# Patient Record
Sex: Male | Born: 1945 | Race: White | Hispanic: No | Marital: Married | Smoking: Former smoker
Health system: Southern US, Community
[De-identification: ages and names within clinical notes are randomized; demographics above are authoritative.]

## PROBLEM LIST (undated history)

## (undated) DIAGNOSIS — F3289 Other specified depressive episodes: Secondary | ICD-10-CM

## (undated) DIAGNOSIS — R7301 Impaired fasting glucose: Secondary | ICD-10-CM

## (undated) DIAGNOSIS — M1610 Unilateral primary osteoarthritis, unspecified hip: Secondary | ICD-10-CM

## (undated) DIAGNOSIS — K5730 Diverticulosis of large intestine without perforation or abscess without bleeding: Secondary | ICD-10-CM

## (undated) DIAGNOSIS — F411 Generalized anxiety disorder: Secondary | ICD-10-CM

## (undated) DIAGNOSIS — I519 Heart disease, unspecified: Secondary | ICD-10-CM

## (undated) DIAGNOSIS — M1990 Unspecified osteoarthritis, unspecified site: Secondary | ICD-10-CM

## (undated) DIAGNOSIS — E669 Obesity, unspecified: Secondary | ICD-10-CM

## (undated) DIAGNOSIS — F329 Major depressive disorder, single episode, unspecified: Secondary | ICD-10-CM

## (undated) HISTORY — DX: Other specified depressive episodes: F32.89

## (undated) HISTORY — PX: KNEE SURGERY: SHX244

## (undated) HISTORY — DX: Impaired fasting glucose: R73.01

## (undated) HISTORY — DX: Obesity, unspecified: E66.9

## (undated) HISTORY — DX: Heart disease, unspecified: I51.9

## (undated) HISTORY — PX: APPENDECTOMY: SHX54

## (undated) HISTORY — DX: Generalized anxiety disorder: F41.1

## (undated) HISTORY — DX: Unilateral primary osteoarthritis, unspecified hip: M16.10

## (undated) HISTORY — DX: Major depressive disorder, single episode, unspecified: F32.9

## (undated) HISTORY — DX: Diverticulosis of large intestine without perforation or abscess without bleeding: K57.30

---

## 2005-10-31 ENCOUNTER — Ambulatory Visit: Payer: Self-pay | Admitting: Family Medicine

## 2007-08-24 ENCOUNTER — Encounter: Admission: RE | Admit: 2007-08-24 | Discharge: 2007-08-24 | Payer: Self-pay | Admitting: Family Medicine

## 2007-08-24 ENCOUNTER — Ambulatory Visit: Payer: Self-pay | Admitting: Family Medicine

## 2007-09-11 ENCOUNTER — Ambulatory Visit: Payer: Self-pay | Admitting: Gastroenterology

## 2007-09-11 ENCOUNTER — Encounter: Payer: Self-pay | Admitting: Gastroenterology

## 2007-09-24 ENCOUNTER — Ambulatory Visit: Payer: Self-pay | Admitting: Cardiology

## 2007-09-25 ENCOUNTER — Ambulatory Visit: Payer: Self-pay | Admitting: Gastroenterology

## 2007-11-30 ENCOUNTER — Ambulatory Visit: Payer: Self-pay | Admitting: Family Medicine

## 2008-02-08 DIAGNOSIS — K5730 Diverticulosis of large intestine without perforation or abscess without bleeding: Secondary | ICD-10-CM

## 2008-02-08 HISTORY — PX: NASAL TURBINATE REDUCTION: SHX2072

## 2008-02-08 HISTORY — PX: UVULECTOMY: SHX2631

## 2008-02-08 HISTORY — DX: Diverticulosis of large intestine without perforation or abscess without bleeding: K57.30

## 2009-03-02 ENCOUNTER — Ambulatory Visit: Payer: Self-pay | Admitting: Family Medicine

## 2009-04-28 ENCOUNTER — Ambulatory Visit: Payer: Self-pay | Admitting: Physician Assistant

## 2009-10-08 ENCOUNTER — Ambulatory Visit: Payer: Self-pay | Admitting: Family Medicine

## 2009-12-28 ENCOUNTER — Ambulatory Visit: Payer: Self-pay | Admitting: Family Medicine

## 2010-03-09 NOTE — Miscellaneous (Signed)
Summary: GI previsit  Clinical Lists Changes  Medications: Added new medication of MOVIPREP 100 GM  SOLR (PEG-KCL-NACL-NASULF-NA ASC-C) As per prep instructions. - Signed Rx of MOVIPREP 100 GM  SOLR (PEG-KCL-NACL-NASULF-NA ASC-C) As per prep instructions.;  #1 x 0;  Signed;  Entered by: Jennye Boroughs RN;  Authorized by: Meryl Dare MD [facility];  Method used: Electronic    Prescriptions: MOVIPREP 100 GM  SOLR (PEG-KCL-NACL-NASULF-NA ASC-C) As per prep instructions.  #1 x 0   Entered by:   Jennye Boroughs RN   Authorized by:   Meryl Dare MD [facility]   Signed by:   Jennye Boroughs RN on 09/11/2007   Method used:   Electronically sent to ...       [facility]  [address] [id number]       [address]       [place], [place]  [address]       Ph: [phone] or [phone]       Fax: [phone]   RxID:   [id number]

## 2010-04-26 ENCOUNTER — Ambulatory Visit (INDEPENDENT_AMBULATORY_CARE_PROVIDER_SITE_OTHER): Payer: BC Managed Care – PPO | Admitting: Family Medicine

## 2010-04-26 DIAGNOSIS — M25559 Pain in unspecified hip: Secondary | ICD-10-CM

## 2010-04-26 DIAGNOSIS — H8110 Benign paroxysmal vertigo, unspecified ear: Secondary | ICD-10-CM

## 2010-06-22 NOTE — Assessment & Plan Note (Signed)
[facility]                            CARDIOLOGY OFFICE NOTE   NAME:Jacob Huber                        MRN:          [id number]  DATE:09/24/2007                            DOB:          05-17-1945    Mr.  Jacob Huber is a 65 year old married white male who is referred  by Dr. Sharlot Gowda for consultation concerning a family history of  coronary artery disease and an abnormal EKG.   Ms. Scalise is again 65 years of age and is currently having no symptoms  of angina.  He just got back from a camping trip at the beach and said  the more he walked, the better he felt.  He denies any exertional angina  or shortness of breath.   His risk factors for coronary artery disease include age, sex, and being  a bit overweight.  He also is fairly sedentary only doing weekend yard  work.   PAST MEDICAL HISTORY:  He quit smoking 12 years ago.  He does not drink  alcohol.   He has no history of dye allergies.   Current meds are Arthrotec 75 mg b.i.d., multivitamin, Omega-3, prostate  supplement, and enteric-coated aspirin 325 p.r.n.   SURGICAL HISTORY:  Appendectomy in 1970, knee surgery in 1975, and  sinus surgery in 1979.   FAMILY HISTORY:  His mother had a heart attack at age 34 and died.  She  had multiple cardiac risk factors however.   SOCIAL HISTORY:  He is Manufacturing systems engineer.  He has 2 children.  He is  married.   REVIEW OF SYSTEMS:  Completely negative with all systems questioned.   PHYSICAL EXAMINATION:  VITAL SIGNS:  His blood pressure today is 143/90,  his pulse 77 and regular, his weight is 241 pounds, he is 6 feet tall.  HEENT:  He is bearded.  PERRLA.  Extraocular movements intact.  Sclerae  are clear.  Facial symmetry is normal.  Carotid upstrokes are equal  bilaterally without bruits.  No JVD.  Thyroid is not enlarged.  NECK:  Supple.  LUNGS:  Clear.  HEART:  Reveals a regular rate and rhythm.  No gallop.  ABDOMEN:  Soft.  Good  bowel sounds.  No midline bruit.  No hepatomegaly.  EXTREMITIES:  No cyanosis, clubbing, or edema.  Pulses are intact.  NEURO:  Intact.   EKG shows normal sinus rhythm with minimal early repolarization.   ASSESSMENT/PLAN:  Mr. Dykstra is currently not having any symptoms of  ischemic heart disease.  We reviewed at length his risk factors  including age, sex, being overweight, and sedentary lifestyle.  His risk  is still quite low of having obstructive coronary disease.  I do not  think a stress test will be helpful.   PLAN:  1. A 20-25 pounds of weight reduction.  2. Three hours of exercise per week.   I just received his lipid panel, which is remarkably reassuring.  His  total cholesterol is 128, triglycerides 59, HDL is 57, total cholesterol  to HDL ratio was 2.2, LDL is 59, on no meds!  I shared this good news with him as well.   I will see him back on a p.r.n. basis.     Thomas C. Daleen Squibb, MD, [facility]  Electronically Signed    TCW/MedQ  DD: 09/24/2007  DT: 09/25/2007  Job #: [id number]   cc:   Sharlot Gowda, M.D.

## 2010-07-19 ENCOUNTER — Other Ambulatory Visit: Payer: Self-pay | Admitting: Family Medicine

## 2010-07-19 MED ORDER — TRAMADOL HCL 50 MG PO TABS: 50.0000 mg | ORAL_TABLET | Freq: Four times a day (QID) | ORAL | Status: DC | PRN

## 2010-07-19 NOTE — Telephone Encounter (Signed)
Is this ok?

## 2010-07-19 NOTE — Telephone Encounter (Signed)
CALLED MED IN  

## 2010-09-02 ENCOUNTER — Other Ambulatory Visit: Payer: Self-pay | Admitting: Medical

## 2010-09-02 MED ORDER — DULOXETINE HCL 60 MG PO CPEP: 60.0000 mg | ORAL_CAPSULE | Freq: Every day | ORAL | Status: DC

## 2010-10-04 ENCOUNTER — Other Ambulatory Visit: Payer: Self-pay | Admitting: Family Medicine

## 2010-10-05 ENCOUNTER — Other Ambulatory Visit: Payer: Self-pay | Admitting: Family Medicine

## 2010-10-05 ENCOUNTER — Telehealth: Payer: Self-pay | Admitting: Family Medicine

## 2010-10-05 MED ORDER — ZOLPIDEM TARTRATE 10 MG PO TABS: 10.0000 mg | ORAL_TABLET | Freq: Every evening | ORAL | Status: DC | PRN

## 2010-10-05 NOTE — Telephone Encounter (Signed)
Called ambien and tramadol in

## 2010-10-05 NOTE — Telephone Encounter (Signed)
Is this ok?

## 2010-10-18 ENCOUNTER — Ambulatory Visit (INDEPENDENT_AMBULATORY_CARE_PROVIDER_SITE_OTHER): Payer: BC Managed Care – PPO | Admitting: Family Medicine

## 2010-10-18 ENCOUNTER — Encounter: Payer: Self-pay | Admitting: Family Medicine

## 2010-10-18 VITALS — BP 132/80 | HR 84 | Temp 98.9°F | Ht 71.0 in | Wt 232.0 lb

## 2010-10-18 DIAGNOSIS — M26629 Arthralgia of temporomandibular joint, unspecified side: Secondary | ICD-10-CM

## 2010-10-18 DIAGNOSIS — H9209 Otalgia, unspecified ear: Secondary | ICD-10-CM

## 2010-10-18 DIAGNOSIS — H9201 Otalgia, right ear: Secondary | ICD-10-CM

## 2010-10-18 DIAGNOSIS — J309 Allergic rhinitis, unspecified: Secondary | ICD-10-CM

## 2010-10-18 NOTE — Patient Instructions (Addendum)
Bring copies of any outside labwork that was recently done, to your physical, to aid in deciding what labs need to be ordered/repeated  I recommend that you start taking Loratidine (Claritin)--this will help with your sneezing, runny nose. I think there may be a component of temperomandibular joint pain (TMJ) causing your ear pain.  Taking ibuprofen regularly for up to a week may help decrease any inflammation in the joint (4 tablets--800mg , up to three times a day, taken with food).  Stop the ibuprofen if it bothers your stomach.  You may need to take Prilosec or Pepcid or Zantac along with the ibuprofen if it bothers your stomach, and/or lower the dose.    Try and stop grinding your teeth.  Consider talking to your dentist about this problem  TMJ Problems  (Temporal Mandibular Joint Dysfunction) TMJ dysfunction means there are problems with the joint between your jaw and your skull. This is a joint lined by cartilage like other joints in your body but also has a small disc in the joint which keeps the bones from rubbing on each other. These joints are like other joints and can get inflamed (sore) from arthritis and other problems. When this joint gets sore, it can cause headaches and pain in the jaw and the face. CAUSES Usually the arthritic types of problems are caused by soreness in the joint. Soreness in the joint can also be caused by overuse. This may come from grinding your teeth. It may also come from mis-alignment in the joint. DIAGNOSIS (LEARNING WHAT IS WRONG) Diagnosis of this condition can often be made by history and exam. Sometimes your caregiver may need X-rays or an MRI scan to determine the exact cause. It may be necessary to see your dentist to determine if your teeth and jaws are lined up correctly. TREATMENT Most of the time this problem is not serious; however, sometimes it can persist (become chronic). When this happens medications that will cut down on inflammation (soreness)  help. Sometimes a shot of cortisone into the joint will be helpful. If your teeth are not aligned it may help for your dentist to make a splint for your mouth that can help this problem. If no physical problems can be found, the problem may come from tension. If tension is found to be the cause, biofeedback or relaxation techniques may be helpful. HOME CARE INSTRUCTIONS  Later in the day, applications of ice packs may be helpful. Ice can be used in a plastic bag with a towel around it to prevent frostbite to skin. This may be used about every 2 hours for 20 to 30 minutes, as needed while awake, or as directed by your caregiver.   Only take over-the-counter or prescription medicines for pain, discomfort, or fever as directed by your caregiver.   If physical therapy was prescribed, follow your caregiver's directions.   Wear mouth appliances as directed if they were given.  Document Released: 10/19/2000 Document Re-Released: 04/22/2008 [facility] Patient Information 2011 [place], [place].

## 2010-10-18 NOTE — Progress Notes (Signed)
Patient presents with 2 weeks of right ear pain, pressure.  Some discomfort with wiggling of the jaw.  Denies popping or clicking in the ear, decreased hearing.  Has been sneezing, runny nose.  Some sore throat, cough.  Hasn't been using any over-the-counter medications for allergies.  Denies fevers, chills.  He does admit to clenching/grinding his teeth, stress  Past Medical History  Diagnosis Date  . Depressive disorder, not elsewhere classified   . Anxiety state, unspecified   . Hip arthritis     R hip ([facility] in past)  . Impaired fasting glucose     Past Surgical History  Procedure Date  . Appendectomy age 10  . Knee surgery age 84    left knee  . Uvulectomy 2010  . Nasal turbinate reduction 2010   Current outpatient prescriptions:Ascorbic Acid (VITAMIN C) 1000 MG tablet, Take 1,000 mg by mouth daily.  , Disp: , Rfl: ;  Cholecalciferol (VITAMIN D3) 5000 UNITS CAPS, Take 1 tablet by mouth daily.  , Disp: , Rfl: ;  DULoxetine (CYMBALTA) 60 MG capsule, Take 1 capsule (60 mg total) by mouth daily., Disp: 30 capsule, Rfl: 1;  fish oil-omega-3 fatty acids 1000 MG capsule, Take 1 g by mouth daily.  , Disp: , Rfl:  Glucosamine-Chondroit-Vit C-Mn (GLUCOSAMINE CHONDR 1500 COMPLX PO), Take 1 tablet by mouth daily.  , Disp: , Rfl: ;  ibuprofen (ADVIL,MOTRIN) 200 MG tablet, Take 800 mg by mouth every 8 (eight) hours as needed. As needed for hip arthritis pain , Disp: , Rfl: ;  Multiple Vitamins-Minerals (MULTIVITAMIN WITH MINERALS) tablet, Take 1 tablet by mouth daily.  , Disp: , Rfl:  saw palmetto 500 MG capsule, Take 500-1,000 mg by mouth daily.  , Disp: , Rfl: ;  traMADol (ULTRAM) 50 MG tablet, Take 1 tablet (50 mg total) by mouth every 6 (six) hours as needed for pain., Disp: 90 tablet, Rfl: 1;  vitamin E 800 UNIT capsule, Take 800 Units by mouth daily.  , Disp: , Rfl: ;  zolpidem (AMBIEN) 10 MG tablet, Take 1 tablet (10 mg total) by mouth at bedtime as needed for sleep., Disp: 30 tablet, Rfl:  1 aspirin 325 MG tablet, Take 325 mg by mouth daily.  , Disp: , Rfl:   No Known Allergies  ROS:  Denies fevers, skin rash, productive cough, shortness of breath, chest pain, headache.  See HPI  PHYSICAL EXAM: BP 132/80  Pulse 84  Temp(Src) 98.9 F (37.2 C) (Oral)  Ht 5\' 11"  (1.803 m)  Wt 232 lb (105.235 kg)  BMI 32.36 kg/m2 Well developed, pleasant male in no distress Tender at R TMJ; clicking with jaw movement HEENT: PERRL, EOMI, conjunctiva clear.  TM's and EAC's normal bilaterally.  Small bubble noted behind R TM, but no bulging or erythema noted.  Sinuses nontender.  Nasal mucosa mildly edematous, no erythema Neck: no lymphadenopathy or thyromegaly or mass Heart: regular rate and rhythm without murmur Lungs: clear bilaterally Skin: no rash Psych: normal mood, affect, hygiene and grooming  ASSESSMENT/PLAN: 1. Allergic rhinitis, cause unspecified   2. TMJ tenderness   3. Otalgia of right ear    Recommend Claritin for allergies Recommend ibuprofen (up to 800mg  TID with food) for up to a week for treatment of TMJ.  Recommended discussing with his dentist (may need bite guard or other treatment) Avoid gum chewing  F/u for CPE. Due for annual labs. Will bring copies of labs recently done through work

## 2010-10-28 ENCOUNTER — Encounter: Payer: Self-pay | Admitting: Family Medicine

## 2010-11-10 ENCOUNTER — Telehealth: Payer: Self-pay | Admitting: Family Medicine

## 2010-11-10 MED ORDER — DULOXETINE HCL 60 MG PO CPEP: 60.0000 mg | ORAL_CAPSULE | Freq: Every day | ORAL | Status: DC

## 2010-11-10 NOTE — Telephone Encounter (Signed)
E-rx'd to pharmacy.  He is due for CPE, yet hasn't scheduled.  Please have him schedule CPE. Thanks

## 2010-11-10 NOTE — Telephone Encounter (Signed)
Called patient to inform him that rx for Cymbalta was sent to pharmacy. He scheduled CPE for 12/15/10 @9 :30am, just an FYI. Thanks.

## 2010-12-15 ENCOUNTER — Encounter: Payer: Self-pay | Admitting: Family Medicine

## 2010-12-15 ENCOUNTER — Ambulatory Visit (INDEPENDENT_AMBULATORY_CARE_PROVIDER_SITE_OTHER): Payer: BC Managed Care – PPO | Admitting: Family Medicine

## 2010-12-15 VITALS — BP 130/86 | HR 88 | Ht 71.0 in | Wt 234.0 lb

## 2010-12-15 DIAGNOSIS — F329 Major depressive disorder, single episode, unspecified: Secondary | ICD-10-CM

## 2010-12-15 DIAGNOSIS — Z23 Encounter for immunization: Secondary | ICD-10-CM

## 2010-12-15 DIAGNOSIS — F3289 Other specified depressive episodes: Secondary | ICD-10-CM | POA: Insufficient documentation

## 2010-12-15 DIAGNOSIS — Z125 Encounter for screening for malignant neoplasm of prostate: Secondary | ICD-10-CM

## 2010-12-15 DIAGNOSIS — Z0000 Encounter for general adult medical examination without abnormal findings: Secondary | ICD-10-CM

## 2010-12-15 DIAGNOSIS — M25559 Pain in unspecified hip: Secondary | ICD-10-CM

## 2010-12-15 DIAGNOSIS — Z2911 Encounter for prophylactic immunotherapy for respiratory syncytial virus (RSV): Secondary | ICD-10-CM

## 2010-12-15 DIAGNOSIS — Z79899 Other long term (current) drug therapy: Secondary | ICD-10-CM

## 2010-12-15 LAB — COMPREHENSIVE METABOLIC PANEL
Albumin: 4.9 g/dL (ref 3.5–5.2)
BUN: 17 mg/dL (ref 6–23)
CO2: 27 mEq/L (ref 19–32)
Calcium: 9.6 mg/dL (ref 8.4–10.5)
Chloride: 101 mEq/L (ref 96–112)
Creat: 1.01 mg/dL (ref 0.50–1.35)
Glucose, Bld: 87 mg/dL (ref 70–99)
Potassium: 4.8 mEq/L (ref 3.5–5.3)

## 2010-12-15 LAB — POCT URINALYSIS DIPSTICK
Bilirubin, UA: NEGATIVE
Blood, UA: NEGATIVE
Ketones, UA: NEGATIVE
Protein, UA: NEGATIVE
pH, UA: 7

## 2010-12-15 MED ORDER — DULOXETINE HCL 60 MG PO CPEP: 60.0000 mg | ORAL_CAPSULE | Freq: Every day | ORAL | Status: DC

## 2010-12-15 MED ORDER — TRAMADOL HCL 50 MG PO TABS: 50.0000 mg | ORAL_TABLET | Freq: Four times a day (QID) | ORAL | Status: DC | PRN

## 2010-12-15 NOTE — Progress Notes (Signed)
Jacob Huber is a 65 y.o. male who presents for a complete physical.  He has the following concerns: Ongoing R hip pain.  Used to take a lot of ibuprofen, recently changed to Aleve.  Would like to have kidney blood tests done.  He brought in labs from work done 09/30/10 showing A1c 5.7, glucose 96 and excellent lipid profile (total 124, HDL 67, LDL 46, TG 53, ratio 1.9).  Immunization History  Administered Date(s) Administered  . Influenza Split 11/08/2010  . Influenza Whole 12/13/2001  . Tdap 08/24/2007   Last colonoscopy: 2010 Last PSA: 10/2009 Dentist: 3 years ago Ophtho: a few years ago Exercise: "not much".  Yardwork on weekends, home renovations  Past Medical History  Diagnosis Date  . Depressive disorder, not elsewhere classified   . Hip arthritis     R hip ([facility] in past)  . Impaired fasting glucose   . Obesity   . GAD (generalized anxiety disorder)   . Anxiety state, unspecified   . Cardiac disease     FAM HX  . Diverticulosis of colon 2010    Past Surgical History  Procedure Date  . Appendectomy age 55  . Knee surgery age 64    left knee  . Uvulectomy 2010  . Nasal turbinate reduction 2010    History   Social History  . Marital Status: Married    Spouse Name: N/A    Number of Children: 2  . Years of Education: N/A   Occupational History  . Theatre manager   Social History Main Topics  . Smoking status: Former Smoker    Quit date: 02/07/1994  . Smokeless tobacco: Never Used  . Alcohol Use: No  . Drug Use: No  . Sexually Active: Yes -- Male partner(s)   Other Topics Concern  . Not on file   Social History Narrative   Lives with wife, 4 dogs.  Has 2 kids from previous marriage    Family History  Problem Relation Age of Onset  . Arthritis Mother   . Diabetes Mother   . Heart disease Mother 10    MI  . Hypertension Mother   . Alcohol abuse Mother   . Cancer Father 68    prostate  . Urolithiasis Father      Current outpatient prescriptions:Ascorbic Acid (VITAMIN C) 1000 MG tablet, Take 1,000 mg by mouth daily.  , Disp: , Rfl: ;  b complex vitamins tablet, Take 1 tablet by mouth daily.  , Disp: , Rfl: ;  Cholecalciferol (VITAMIN D3) 5000 UNITS CAPS, Take 1 tablet by mouth daily.  , Disp: , Rfl: ;  DULoxetine (CYMBALTA) 60 MG capsule, Take 1 capsule (60 mg total) by mouth daily., Disp: 30 capsule, Rfl: 1 fish oil-omega-3 fatty acids 1000 MG capsule, Take 1 g by mouth daily.  , Disp: , Rfl: ;  Glucosamine-Chondroit-Vit C-Mn (GLUCOSAMINE CHONDR 1500 COMPLX PO), Take 1 tablet by mouth daily.  , Disp: , Rfl: ;  Multiple Vitamins-Minerals (MULTIVITAMIN WITH MINERALS) tablet, Take 1 tablet by mouth daily.  , Disp: , Rfl:  naproxen sodium (ANAPROX) 220 MG tablet, Take 220 mg by mouth 2 (two) times daily with a meal. Uses as needed for pain , Disp: , Rfl: ;  saw palmetto 500 MG capsule, Take 500-1,000 mg by mouth daily.  , Disp: , Rfl: ;  traMADol (ULTRAM) 50 MG tablet, Take 1 tablet (50 mg total) by mouth every 6 (six) hours as needed for pain., Disp: 90  tablet, Rfl: 1;  vitamin E 800 UNIT capsule, Take 800 Units by mouth daily.  , Disp: , Rfl:  aspirin 325 MG tablet, Take 325 mg by mouth daily.  , Disp: , Rfl:   No Known Allergies  ROS:  The patient denies anorexia, fever, weight changes, headaches,  vision loss, decreased hearing, ear pain, hoarseness, chest pain, palpitations, dizziness, syncope, dyspnea on exertion, cough, swelling, nausea, vomiting, diarrhea, constipation, abdominal pain, melena, hematochezia, indigestion/heartburn, hematuria, incontinence, erectile dysfunction, nocturia, weakened urine stream, dysuria, genital lesions, numbness, tingling, weakness, tremor, suspicious skin lesions, depression, anxiety, abnormal bleeding/bruising, or enlarged lymph nodes +R hip pain, L knee pain.  Occasional mild intermittent dizziness/vertigo  PHYSICAL EXAM: BP 130/86  Pulse 88  Ht 5\' 11"  (1.803 m)  Wt  234 lb (106.142 kg)  BMI 32.64 kg/m2  General Appearance:    Alert, cooperative, no distress, appears stated age  Head:    Normocephalic, without obvious abnormality, atraumatic  Eyes:    PERRL, conjunctiva/corneas clear, EOM's intact, fundi    benign  Ears:    Normal TM's and external ear canals  Nose:   Nares normal, mucosa normal, no drainage or sinus   tenderness  Throat:   Lips, mucosa, and tongue normal; teeth and gums normal  Neck:   Supple, no lymphadenopathy;  thyroid:  no   enlargement/tenderness/nodules; no carotid   bruit or JVD  Back:    Spine nontender, no curvature, ROM normal, no CVA     tenderness  Lungs:     Clear to auscultation bilaterally without wheezes, rales or     ronchi; respirations unlabored  Chest Wall:    No tenderness or deformity   Heart:    Regular rate and rhythm, S1 and S2 normal, no murmur, rub   or gallop  Breast Exam:    No chest wall tenderness, masses or gynecomastia  Abdomen:     Soft, non-tender, nondistended, normoactive bowel sounds,    no masses, no hepatosplenomegaly  Genitalia:    Normal male external genitalia without lesions.  Testicles without masses.  No inguinal hernias.  Rectal:    Normal sphincter tone, no masses or tenderness; guaiac negative stool.  Prostate smooth, no nodules, not enlarged.  Extremities:   No clubbing, cyanosis or edema  Pulses:   2+ and symmetric all extremities  Skin:   Skin color, texture, turgor normal, no rashes or lesions  Lymph nodes:   Cervical, supraclavicular, and axillary nodes normal  Neurologic:   CNII-XII intact, normal strength, sensation and gait; reflexes 2+ and symmetric throughout          Psych:   Normal mood, affect, hygiene and grooming.      ASSESSMENT/PLAN:  1. Routine general medical examination at a health care facility  POCT Urinalysis Dipstick, Visual acuity screening  2. Special screening for malignant neoplasm of prostate  PSA  3. Encounter for long-term (current) use of other  medications  Comprehensive metabolic panel  4. Need for shingles vaccine  Varicella-zoster vaccine subcutaneous  5. Hip pain  traMADol (ULTRAM) 50 MG tablet  6. Depressive disorder, not elsewhere classified  DULoxetine (CYMBALTA) 60 MG capsule   PSA screening (as also requested by pt), recommended at least 30 minutes of aerobic activity at least 5 days/week; proper sunscreen use reviewed; healthy diet and alcohol recommendations (less than or equal to 2 drinks/day) reviewed; regular seatbelt use; changing batteries in smoke detectors. Self-testicular exams. Immunization recommendations discussed--Zostavax given today.  Risks and benefits reviewed.  Colonoscopy recommendations reviewed--UTD.

## 2010-12-15 NOTE — Patient Instructions (Addendum)
HEALTH MAINTENANCE RECOMMENDATIONS:  It is recommended that you get at least 30 minutes of aerobic exercise at least 5 days/week (for weight loss, you may need as much as 60-90 minutes). This can be any activity that gets your heart rate up. This can be divided in 10-15 minute intervals if needed, but try and build up your endurance at least once a week.  Weight bearing exercise is also recommended twice weekly.  Eat a healthy diet with lots of vegetables, fruits and fiber.  "Colorful" foods have a lot of vitamins (ie green vegetables, tomatoes, red peppers, etc).  Limit sweet tea, regular sodas and alcoholic beverages, all of which has a lot of calories and sugar.  Up to 2 alcoholic drinks daily may be beneficial for men (unless trying to lose weight, watch sugars).  Drink a lot of water.  Sunscreen of at least SPF 30 should be used on all sun-exposed parts of the skin when outside between the hours of 10 am and 4 pm (not just when at beach or pool, but even with exercise, golf, tennis, and yard work!)  Use a sunscreen that says "broad spectrum" so it covers both UVA and UVB rays, and make sure to reapply every 1-2 hours.  Remember to change the batteries in your smoke detectors when changing your clock times in the spring and fall.  Use your seat belt every time you are in a car, and please drive safely and not be distracted with cell phones and texting while driving.   Please remember to schedule appointments with dentist and eye doctor. Weight loss is encouraged--please cut back on portions and sweets, and start exercising daily.

## 2010-12-16 ENCOUNTER — Encounter: Payer: Self-pay | Admitting: Family Medicine

## 2011-01-05 ENCOUNTER — Other Ambulatory Visit: Payer: Self-pay | Admitting: Family Medicine

## 2011-01-26 ENCOUNTER — Telehealth: Payer: Self-pay | Admitting: Internal Medicine

## 2011-01-26 NOTE — Telephone Encounter (Signed)
MED CALLED IN #[phone] REFILL

## 2011-01-26 NOTE — Telephone Encounter (Signed)
Refill Ambien

## 2011-03-07 ENCOUNTER — Telehealth: Payer: Self-pay | Admitting: Internal Medicine

## 2011-03-07 NOTE — Telephone Encounter (Signed)
He needs an appointment

## 2011-03-08 NOTE — Telephone Encounter (Signed)
Making appt

## 2011-03-10 ENCOUNTER — Telehealth: Payer: Self-pay | Admitting: Family Medicine

## 2011-03-10 NOTE — Telephone Encounter (Signed)
This was denied by Dr. Susann Givens the other day--told to schedule appt.  None pending

## 2011-03-10 NOTE — Telephone Encounter (Signed)
Left message for patient to schedule appt per Dr.Lalonde and Dr.Knapp. Ambien refill denied until appt is scheduled. Also faxed denial back to pharmacy.

## 2011-03-15 ENCOUNTER — Other Ambulatory Visit: Payer: Self-pay | Admitting: Family Medicine

## 2011-03-16 NOTE — Telephone Encounter (Signed)
RX refill on tramadol.

## 2011-04-09 ENCOUNTER — Other Ambulatory Visit: Payer: Self-pay | Admitting: Family Medicine

## 2011-04-11 NOTE — Telephone Encounter (Signed)
done

## 2011-04-11 NOTE — Telephone Encounter (Signed)
Is this ok refill?  

## 2011-04-28 ENCOUNTER — Ambulatory Visit (INDEPENDENT_AMBULATORY_CARE_PROVIDER_SITE_OTHER): Payer: BC Managed Care – PPO | Admitting: Family Medicine

## 2011-04-28 ENCOUNTER — Encounter: Payer: Self-pay | Admitting: Family Medicine

## 2011-04-28 VITALS — BP 124/84 | HR 84 | Temp 99.1°F | Ht 71.0 in | Wt 239.0 lb

## 2011-04-28 DIAGNOSIS — M25559 Pain in unspecified hip: Secondary | ICD-10-CM

## 2011-04-28 DIAGNOSIS — J029 Acute pharyngitis, unspecified: Secondary | ICD-10-CM

## 2011-04-28 DIAGNOSIS — R509 Fever, unspecified: Secondary | ICD-10-CM

## 2011-04-28 DIAGNOSIS — G4700 Insomnia, unspecified: Secondary | ICD-10-CM

## 2011-04-28 DIAGNOSIS — M25569 Pain in unspecified knee: Secondary | ICD-10-CM

## 2011-04-28 LAB — POCT RAPID STREP A (OFFICE): Rapid Strep A Screen: NEGATIVE

## 2011-04-28 LAB — POCT INFLUENZA A/B
Influenza A, POC: NEGATIVE
Influenza B, POC: NEGATIVE

## 2011-04-28 MED ORDER — TRAMADOL HCL 50 MG PO TABS: 50.0000 mg | ORAL_TABLET | Freq: Three times a day (TID) | ORAL | Status: DC | PRN

## 2011-04-28 MED ORDER — ZOLPIDEM TARTRATE 10 MG PO TABS: 10.0000 mg | ORAL_TABLET | Freq: Every evening | ORAL | Status: DC | PRN

## 2011-04-28 NOTE — Patient Instructions (Signed)
Your flu test was negative. This is most likely a viral illness.  Continue supportive treatments with tylenol for fever and pain as needed.  Continue with guaifenesin and sudafed as needed for thick drainage and sinus pressure.  Call next week if having ongoing fevers (please check your temperature!), or worsening symptoms.  Please look at the color of your mucus/phlegm as this gives [place] important information in decided whether or not antibiotics are needed (if discolored mucus persists >7-10 days).

## 2011-04-28 NOTE — Progress Notes (Signed)
Chief complaint:  Sore throat since Monday. He has been feeling pretty lousy. Has ST, congestion, cough and muscle aches.  HPI: Started 3 days ago with scratchy throat and sore neck, muscle aches, gradually getting worse.  Yesterday had worsening muscle aches all over, worsening cough.  Tactile fevers, some chills--felt worse this morning than he does now. Runny nose, clear mucus.  Occasionally cough is productive, hasn't looked at the color.  Took Sudafed, tylenol, guaifenesin, along with his usual medications.  Also took Zicam.  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  Son had alcoholic hepatitis, aspiration pneumonia (in chemically induced come for a few weeks)--due to be discharged tomorrow after being in hospital for almost 2 months.  Asking for refill of Tramadol--taking 4/day for hip and knee pain.  Hasn't seen ortho in about 2 years.  Waiting for his job to settle down some, and then thinks he would likely need surgery. Got #90 on 3/2, still has some left, takes 2 in the morning, and 2 mid-day.  Also asking for Ambien. Uses it once or twice a week for sleep.  Last refilled by Dr. Susann Givens 12/19 for #30.  Past Medical History  Diagnosis Date  . Depressive disorder, not elsewhere classified   . Hip arthritis     R hip ([facility] in past)  . Impaired fasting glucose   . Obesity   . GAD (generalized anxiety disorder)   . Anxiety state, unspecified   . Cardiac disease     FAM HX  . Diverticulosis of colon 2010    Past Surgical History  Procedure Date  . Appendectomy age 65  . Knee surgery age 73    left knee  . Uvulectomy 2010  . Nasal turbinate reduction 2010    History   Social History  . Marital Status: Married    Spouse Name: N/A    Number of Children: 2  . Years of Education: N/A   Occupational History  . Theatre manager   Social History Main Topics  . Smoking status: Former Smoker    Quit date:  02/07/1994  . Smokeless tobacco: Never Used  . Alcohol Use: No  . Drug Use: No  . Sexually Active: Yes -- Male partner(s)   Other Topics Concern  . Not on file   Social History Narrative   Lives with wife, 4 dogs.  Has 2 kids from previous marriage    Family History  Problem Relation Age of Onset  . Arthritis Mother   . Diabetes Mother   . Heart disease Mother 43    MI  . Hypertension Mother   . Alcohol abuse Mother   . Cancer Father 65    prostate  . Urolithiasis Father    Current Outpatient Prescriptions on File Prior to Visit  Medication Sig Dispense Refill  . Ascorbic Acid (VITAMIN C) 1000 MG tablet Take 1,000 mg by mouth daily.        Marland Kitchen aspirin 325 MG tablet Take 325 mg by mouth daily.        Marland Kitchen b complex vitamins tablet Take 1 tablet by mouth daily.        . Cholecalciferol (VITAMIN D3) 5000 UNITS CAPS Take 1 tablet by mouth daily.        . DULoxetine (CYMBALTA) 60 MG capsule Take 1 capsule (60 mg total) by mouth daily.  30 capsule  11  . fish oil-omega-3 fatty acids 1000 MG capsule Take 1 g by  mouth daily.        . Glucosamine-Chondroit-Vit C-Mn (GLUCOSAMINE CHONDR 1500 COMPLX PO) Take 1 tablet by mouth daily.        . Multiple Vitamins-Minerals (MULTIVITAMIN WITH MINERALS) tablet Take 1 tablet by mouth daily.        . naproxen sodium (ANAPROX) 220 MG tablet Take 220 mg by mouth 2 (two) times daily with a meal. Uses as needed for pain       . saw palmetto 500 MG capsule Take 500-1,000 mg by mouth daily.        . vitamin E 800 UNIT capsule Take 800 Units by mouth daily.         No Known Allergies  ROS:  Denies chest pain, shortness of breath, skin rash, nausea, vomiting, diarrhea.  See HPI.  +intermittent insomnia, hip and knee pain, fevers, sore throat, congestion.  PHYSICAL EXAM: BP 124/84  Pulse 84  Temp(Src) 99.1 F (37.3 C) (Oral)  Ht 5\' 11"  (1.803 m)  Wt 239 lb (108.41 kg)  BMI 33.33 kg/m2 Well developed male, in no distress, not coughing. HEENT:   PERRL, EOMI, conjunctiva clear.  TM's and EAC's normal.  OP appears normal no significant erythema. Sinuses nontender Neck: no lymphadenopathy or mass Heart: regular rate and rhythm without murmur Lungs: clear bilaterally with good air movement Extremities: no edema Skin: no rash Psych: normal mood, affect, hygiene and grooming  Strep test was negative, but nurse reportedly had a hard time getting, due to significant gag reflex. Flu test negative  ASSESSMENT/PLAN: 1. Sore throat  POCT rapid strep A, POCT Influenza A/B  2. Fever  POCT rapid strep A, POCT Influenza A/B  3. Knee pain  traMADol (ULTRAM) 50 MG tablet  4. Hip pain  traMADol (ULTRAM) 50 MG tablet  5. Insomnia  zolpidem (AMBIEN) 10 MG tablet   URI/viral syndrome.  Continue supportive measures.  Follow up next week if persistent/worsening symptoms.  Hip/knee pain: Ultram--refill #100.  Needs to f/u with ortho.  Discussed the potential for treatments other than surgery that could give him pain relief, rather than pills (ie cortisone shots)  Insomnia--intermittent.  Refill Ambien.  Patient also tried to mention another problem, after already addressing ambien and tramadol at this acute work-in visit.  He was asking about testosterone.  He was advised to return another time for full discussion of his symptoms, and whether or not labs need to be drawn

## 2011-05-23 ENCOUNTER — Other Ambulatory Visit: Payer: Self-pay | Admitting: Family Medicine

## 2011-05-23 NOTE — Telephone Encounter (Signed)
Is this ok to refill?  

## 2011-05-23 NOTE — Telephone Encounter (Signed)
#  100 lasted about 3 weeks.  He was asked to schedule follow up with ortho.  Okay to refill this once, but I'm not comfortable with him using 4/day longterm without eval by ortho, as we discussed at his last visit

## 2011-06-15 ENCOUNTER — Telehealth: Payer: Self-pay | Admitting: Family Medicine

## 2011-06-15 DIAGNOSIS — G4700 Insomnia, unspecified: Secondary | ICD-10-CM

## 2011-06-15 MED ORDER — ZOLPIDEM TARTRATE 10 MG PO TABS: 10.0000 mg | ORAL_TABLET | Freq: Every evening | ORAL | Status: DC | PRN

## 2011-06-15 NOTE — Telephone Encounter (Signed)
[facility] rx fax req for Zolpidem Tartrate 10 mg 1 po qhs    [facility]             Nothing on pt snapshot

## 2011-06-15 NOTE — Telephone Encounter (Signed)
Called into [facility] [address], zolpidem 10mg  #30 with 1 refill.

## 2011-06-15 NOTE — Telephone Encounter (Signed)
Last refilled 3/21.  Ok for #30 with 1 refill.  Please phone in and document

## 2011-06-27 ENCOUNTER — Other Ambulatory Visit: Payer: Self-pay | Admitting: Family Medicine

## 2011-06-27 NOTE — Telephone Encounter (Signed)
Is this okay?

## 2011-06-27 NOTE — Telephone Encounter (Signed)
Last given #100 on 4/15, so it appears that he is using a little less tramadol than previous.  I still encourage him to f/u with ortho as previously recommended. Okay to refill #100 now, since use has decreased slightly.  Still recommend ortho appt to evaluate his pain rather than just masking with pain meds

## 2011-09-01 ENCOUNTER — Telehealth: Payer: Self-pay | Admitting: Internal Medicine

## 2011-09-01 NOTE — Telephone Encounter (Signed)
Left message for patient to return my call. Last fill date was 6/12 and that the refill from the first fill on 5/8. Pt will need appt.

## 2011-09-01 NOTE — Telephone Encounter (Signed)
Last written 5/8 with 1 refill (#30).  Please check fax to see when was last filled.  Per last OV with me in March, he reported only using it a couple of times per week.  If he has had significant increase in use, and taking it most days, then will need OV to discuss safety/possible change meds.  It is not intended for longterm chronic use.

## 2011-09-05 ENCOUNTER — Telehealth: Payer: Self-pay | Admitting: *Deleted

## 2011-09-05 NOTE — Telephone Encounter (Signed)
Spoke with patient and he stated that he has an appt for surgical clearance scheduled Aug 21, her would like to address the Ambien use and possible change of meds at this visit, if possible. Also he let me know that he doesn't actually use the Ambien every night, but he does "share" them with his wife and that is why they get used up so quickly. Just an FYI, thanks.

## 2011-09-05 NOTE — Telephone Encounter (Signed)
Noted.  Will need to discuss his "sharing" at his visit.

## 2011-09-19 ENCOUNTER — Other Ambulatory Visit: Payer: Self-pay | Admitting: Orthopedic Surgery

## 2011-09-19 MED ORDER — DEXAMETHASONE SODIUM PHOSPHATE 10 MG/ML IJ SOLN: 10.0000 mg | Freq: Once | INTRAMUSCULAR | Status: DC

## 2011-09-19 MED ORDER — BUPIVACAINE LIPOSOME 1.3 % IJ SUSP: 20.0000 mL | Freq: Once | INTRAMUSCULAR | Status: DC

## 2011-09-19 NOTE — Progress Notes (Signed)
Preoperative surgical orders have been place into the [facility] hospital system for Jacob Huber on 09/19/2011, 9:21 AM  by Patrica Duel for surgery on 10/17/2011.  Preop Total Hip orders including Experel Injecion, IV Tylenol, and IV Decadron as long as there are no contraindications to the above medications. Avel Peace, PA-C

## 2011-09-22 ENCOUNTER — Encounter: Payer: Self-pay | Admitting: Internal Medicine

## 2011-09-28 ENCOUNTER — Telehealth: Payer: Self-pay | Admitting: *Deleted

## 2011-09-28 ENCOUNTER — Encounter: Payer: Self-pay | Admitting: Family Medicine

## 2011-09-28 ENCOUNTER — Ambulatory Visit (INDEPENDENT_AMBULATORY_CARE_PROVIDER_SITE_OTHER): Payer: BC Managed Care – PPO | Admitting: Family Medicine

## 2011-09-28 VITALS — BP 140/86 | HR 88 | Ht 71.0 in | Wt 243.0 lb

## 2011-09-28 DIAGNOSIS — M25559 Pain in unspecified hip: Secondary | ICD-10-CM

## 2011-09-28 DIAGNOSIS — R030 Elevated blood-pressure reading, without diagnosis of hypertension: Secondary | ICD-10-CM

## 2011-09-28 DIAGNOSIS — E669 Obesity, unspecified: Secondary | ICD-10-CM

## 2011-09-28 NOTE — Patient Instructions (Addendum)
I recommend you try and work on your diet (ie cutting back on sweets and portion sizes) in order to lose some weight.  Your blood pressure was borderline/elevated today, has been better in the past.  Losing weight will help, as will limiting sodium in your diet. Try and get exercise daily.  2 Gram Low Sodium Diet A 2 gram sodium diet restricts the amount of sodium in the diet to no more than 2 g or 2000 mg daily. Limiting the amount of sodium is often used to help lower blood pressure. It is important if you have heart, liver, or kidney problems. Many foods contain sodium for flavor and sometimes as a preservative. When the amount of sodium in a diet needs to be low, it is important to know what to look for when choosing foods and drinks. The following includes some information and guidelines to help make it easier for you to adapt to a low sodium diet. QUICK TIPS  Do not add salt to food.   Avoid convenience items and fast food.   Choose unsalted snack foods.   Buy lower sodium products, often labeled as "lower sodium" or "no salt added."   Check food labels to learn how much sodium is in 1 serving.   When eating at a restaurant, ask that your food be prepared with less salt or none, if possible.  READING FOOD LABELS FOR SODIUM INFORMATION The nutrition facts label is a good place to find how much sodium is in foods. Look for products with no more than 500 to 600 mg of sodium per meal and no more than 150 mg per serving. Remember that 2 g = 2000 mg. The food label may also list foods as:  Sodium-free: Less than 5 mg in a serving.   Very low sodium: 35 mg or less in a serving.   Low-sodium: 140 mg or less in a serving.   Light in sodium: 50% less sodium in a serving. For example, if a food that usually has 300 mg of sodium is changed to become light in sodium, it will have 150 mg of sodium.   Reduced sodium: 25% less sodium in a serving. For example, if a food that usually has 400 mg  of sodium is changed to reduced sodium, it will have 300 mg of sodium.  CHOOSING FOODS Grains  Avoid: Salted crackers and snack items. Some cereals, including instant hot cereals. Bread stuffing and biscuit mixes. Seasoned rice or pasta mixes.   Choose: Unsalted snack items. Low-sodium cereals, oats, puffed wheat and rice, shredded wheat. English muffins and bread. Pasta.  Meats  Avoid: Salted, canned, smoked, spiced, pickled meats, including fish and poultry. Bacon, ham, sausage, cold cuts, hot dogs, anchovies.   Choose: Low-sodium canned tuna and salmon. Fresh or frozen meat, poultry, and fish.  Dairy  Avoid: Processed cheese and spreads. Cottage cheese. Buttermilk and condensed milk. Regular cheese.   Choose: Milk. Low-sodium cottage cheese. Yogurt. Sour cream. Low-sodium cheese.  Fruits and Vegetables  Avoid: Regular canned vegetables. Regular canned tomato sauce and paste. Frozen vegetables in sauces. Olives. Rosita Fire. Relishes. Sauerkraut.   Choose: Low-sodium canned vegetables. Low-sodium tomato sauce and paste. Frozen or fresh vegetables. Fresh and frozen fruit.  Condiments  Avoid: Canned and packaged gravies. Worcestershire sauce. Tartar sauce. Barbecue sauce. Soy sauce. Steak sauce. Ketchup. Onion, garlic, and table salt. Meat flavorings and tenderizers.   Choose: Fresh and dried herbs and spices. Low-sodium varieties of mustard and ketchup. Lemon juice. [place]  sauce. Horseradish.  SAMPLE 2 GRAM SODIUM MEAL PLAN Breakfast / Sodium (mg)  1 cup low-fat milk / 143 mg   2 slices whole-wheat toast / 270 mg   1 tbs heart-healthy margarine / 153 mg   1 hard-boiled egg / 139 mg   1 small orange / 0 mg  Lunch / Sodium (mg)  1 cup raw carrots / 76 mg    cup hummus / 298 mg   1 cup low-fat milk / 143 mg    cup red grapes / 2 mg   1 whole-wheat pita bread / 356 mg  Dinner / Sodium (mg)  1 cup whole-wheat pasta / 2 mg   1 cup low-sodium tomato sauce / 73 mg   3  oz lean ground beef / 57 mg   1 small side salad (1 cup raw spinach leaves,  cup cucumber,  cup yellow bell pepper) with 1 tsp olive oil and 1 tsp red wine vinegar / 25 mg  Snack / Sodium (mg)  1 container low-fat vanilla yogurt / 107 mg   3 graham cracker squares / 127 mg  Nutrient Analysis  Calories: 2033   Protein: 77 g   Carbohydrate: 282 g   Fat: 72 g   Sodium: 1971 mg  Document Released: 01/24/2005 Document Revised: 01/13/2011 Document Reviewed: 04/27/2009 [facility] Patient Information 2012 [place], [place].

## 2011-09-28 NOTE — Telephone Encounter (Signed)
error 

## 2011-09-28 NOTE — Progress Notes (Signed)
Chief Complaint  Patient presents with  . Advice Only    surgical clearance for right hip surgery with Dr.Alusio.   Patient presents for surgical clearance--forms faxed over from orthopedist.  He denies any specific concerns. He has right hip pain--currently 2/10, but with activity and certain positions it is much more painful.  Chart reviewed--lipids done (through work) 09/2010:  Cholesterol 127; HDL 67, LDL 46, TG 53  Denies headaches, dizziness, chest pain, palpitations, edema.  Denies shortness of breath--just with heavy labor, while doing the activity gets out of breath. No problems with usual activities.  Hikes with dogs once a week without problems. Last surgery was in 2010, and was uneventful related to anesthesia.  Past Medical History  Diagnosis Date  . Depressive disorder, not elsewhere classified   . Hip arthritis     R hip ([facility] in past)  . Impaired fasting glucose   . Obesity   . GAD (generalized anxiety disorder)   . Anxiety state, unspecified   . Cardiac disease     FAM HX  . Diverticulosis of colon 2010    Past Surgical History  Procedure Date  . Appendectomy age 52  . Knee surgery age 40    left knee  . Uvulectomy 2010  . Nasal turbinate reduction 2010    History   Social History  . Marital Status: Married    Spouse Name: N/A    Number of Children: 2  . Years of Education: N/A   Occupational History  . Theatre manager   Social History Main Topics  . Smoking status: Former Smoker -- 1.0 packs/day for 20 years    Types: Cigarettes    Quit date: 02/07/1994  . Smokeless tobacco: Never Used  . Alcohol Use: No  . Drug Use: No  . Sexually Active: Yes -- Male partner(s)   Other Topics Concern  . Not on file   Social History Narrative   Lives with wife, 3 dogs.  Has 2 kids from previous marriage    Family History  Problem Relation Age of Onset  . Arthritis Mother   . Diabetes Mother   . Heart disease Mother 31   MI  . Hypertension Mother   . Alcohol abuse Mother   . Cancer Father 39    prostate  . Urolithiasis Father   . Stroke Maternal Grandmother 80  . Cancer Maternal Grandfather     prostate  . Cancer Paternal Grandmother     cancerous lump on her jaw (LN?) never treated  . Stroke Paternal Grandfather     40's    Current outpatient prescriptions:Ascorbic Acid (VITAMIN C) 1000 MG tablet, Take 1,000 mg by mouth daily.  , Disp: , Rfl: ;  aspirin 325 MG tablet, Take 325 mg by mouth daily.  , Disp: , Rfl: ;  b complex vitamins tablet, Take 1 tablet by mouth daily.  , Disp: , Rfl: ;  Cholecalciferol (VITAMIN D3) 5000 UNITS CAPS, Take 1 tablet by mouth daily.  , Disp: , Rfl:  DULoxetine (CYMBALTA) 60 MG capsule, Take 1 capsule (60 mg total) by mouth daily., Disp: 30 capsule, Rfl: 11;  fish oil-omega-3 fatty acids 1000 MG capsule, Take 1 g by mouth daily.  , Disp: , Rfl: ;  naproxen sodium (ANAPROX) 220 MG tablet, Take 220 mg by mouth 2 (two) times daily with a meal. Uses as needed for pain , Disp: , Rfl: ;  saw palmetto 500 MG capsule, Take 500-1,000 mg by  mouth daily.  , Disp: , Rfl:  traMADol (ULTRAM) 50 MG tablet, TAKE 1 TABLET EVERY 8 HOURS AS NEEDED FOR PAIN, Disp: 100 tablet, Rfl: 0;  Glucosamine-Chondroit-Vit C-Mn (GLUCOSAMINE CHONDR 1500 COMPLX PO), Take 1 tablet by mouth daily.  , Disp: , Rfl: ;  Multiple Vitamins-Minerals (MULTIVITAMIN WITH MINERALS) tablet, Take 1 tablet by mouth daily.  , Disp: , Rfl: ;  vitamin E 800 UNIT capsule, Take 800 Units by mouth daily.  , Disp: , Rfl:  zolpidem (AMBIEN) 10 MG tablet, Take 1 tablet (10 mg total) by mouth at bedtime as needed., Disp: 30 tablet, Rfl: 1;  DISCONTD: zolpidem (AMBIEN) 10 MG tablet, Take 1 tablet (10 mg total) by mouth at bedtime as needed for sleep., Disp: 30 tablet, Rfl: 0  No Known Allergies  ROS:  Denies fevers, URI symptoms, cough, shortness of breath. Has pain in L knee and R hip.  Eventually plans to have L knee surgery also.  Has  gained about 10 pounds in the last year--attributed to decreased physical activity due to pain, as well as eating more desserts.  Denies GI or GU complaints, skin rashes/lesions, depression/anxiety.  Occasional insomnia, treated with prn ambien.  See HPI  PHYSICAL EXAM: BP 138/86  Pulse 88  Ht 5\' 11"  (1.803 m)  Wt 243 lb (110.224 kg)  BMI 33.89 kg/m2 140/86 on repeat by MD RA, pulse 90 Well developed, pleasant overweight male in no distress HEENT:  PERRL, EOMI, conjunctiva clear, OP clear Neck: no lymphadenopathy, thyromegaly, carotid bruit or mass Heart: regular rate and rhythm without murmur Lungs: clear bilaterally Abdomen: soft, nontender, no organomegaly or mass Extremities: no edema, 2+ pulse Skin: no rash/lesions Psych: normal mood, affect, hygiene and grooming Neuro: alert and oriented.  Cranial nerves grossly intact, normal strength, sensation gait  ASSESSMENT/PLAN: 1. Hip pain   2. Obesity (BMI 30.0-34.9)   3. Elevated BP    Encouraged weight loss, cutting back on sweets, daily exercise.  Counseled regarding risks of obesity, elevated BP.  Pain might contribute to BP and be improved after surgery.  Okay for surgery, assuming pre-op labs and EKG doesn't show anything of concern.  He isn't at high risk.

## 2011-10-06 ENCOUNTER — Encounter (HOSPITAL_COMMUNITY): Payer: Self-pay | Admitting: Pharmacy Technician

## 2011-10-11 ENCOUNTER — Encounter (HOSPITAL_COMMUNITY)
Admission: RE | Admit: 2011-10-11 | Discharge: 2011-10-11 | Disposition: A | Discharge status: Home / Self Care | Payer: BC Managed Care – PPO | Source: Ambulatory Visit | Attending: Orthopedic Surgery | Admitting: Orthopedic Surgery

## 2011-10-11 ENCOUNTER — Ambulatory Visit (HOSPITAL_COMMUNITY)
Admission: RE | Admit: 2011-10-11 | Discharge: 2011-10-11 | Disposition: A | Discharge status: Home / Self Care | Payer: BC Managed Care – PPO | Source: Ambulatory Visit | Attending: Orthopedic Surgery | Admitting: Orthopedic Surgery

## 2011-10-11 ENCOUNTER — Encounter (HOSPITAL_COMMUNITY): Payer: Self-pay

## 2011-10-11 DIAGNOSIS — Z01812 Encounter for preprocedural laboratory examination: Secondary | ICD-10-CM | POA: Insufficient documentation

## 2011-10-11 DIAGNOSIS — M169 Osteoarthritis of hip, unspecified: Secondary | ICD-10-CM | POA: Insufficient documentation

## 2011-10-11 DIAGNOSIS — M1610 Unilateral primary osteoarthritis, unspecified hip: Secondary | ICD-10-CM | POA: Insufficient documentation

## 2011-10-11 HISTORY — DX: Unspecified osteoarthritis, unspecified site: M19.90

## 2011-10-11 LAB — PROTIME-INR: INR: 0.88 (ref 0.00–1.49)

## 2011-10-11 LAB — COMPREHENSIVE METABOLIC PANEL
AST: 20 U/L (ref 0–37)
Albumin: 3.9 g/dL (ref 3.5–5.2)
Calcium: 8.8 mg/dL (ref 8.4–10.5)
Creatinine, Ser: 0.88 mg/dL (ref 0.50–1.35)

## 2011-10-11 LAB — SURGICAL PCR SCREEN: Staphylococcus aureus: NEGATIVE

## 2011-10-11 LAB — CBC
MCH: 28.6 pg (ref 26.0–34.0)
MCV: 85.5 fL (ref 78.0–100.0)
Platelets: 276 10*3/uL (ref 150–400)
RDW: 13 % (ref 11.5–15.5)
WBC: 5.7 10*3/uL (ref 4.0–10.5)

## 2011-10-11 LAB — URINALYSIS, ROUTINE W REFLEX MICROSCOPIC
Leukocytes, UA: NEGATIVE
Nitrite: NEGATIVE
Specific Gravity, Urine: 1.028 (ref 1.005–1.030)
pH: 5.5 (ref 5.0–8.0)

## 2011-10-11 NOTE — Patient Instructions (Signed)
22 Jacob Huber  10/11/2011   Your procedure is scheduled on:  10/17/11 155pm-315pm  Report to [facility] at 1130 AM.  Call this number if you have problems the morning of surgery: [phone]   Remember:   Do not eat food:After Midnight.  May have clear liquids:until 0700am then npo .    Take these medicines the morning of surgery with A SIP OF WATER:   Do not wear jewelry,   Do not wear lotions, powders, or perfumes. .  . Men may shave face and neck.  Do not bring valuables to the hospital.  Contacts, dentures or bridgework may not be worn into surgery.  Leave suitcase in the car. After surgery it may be brought to your room.  For patients admitted to the hospital, checkout time is 11:00 AM the day of discharge.     Special Instructions: CHG Shower Use Special Wash: 1/2 bottle night before surgery and 1/2 bottle morning of surgery. shower chin to toes with CHG.  Wash face and private parts with regular soap.   Please read over the following fact sheets that you were given: MRSA Information, Incentive Spirometry Fact Sheet, Blood Transfusion Fact Sheet, coughing and deep breathing exercises, leg exercises

## 2011-10-16 ENCOUNTER — Other Ambulatory Visit: Payer: Self-pay | Admitting: Orthopedic Surgery

## 2011-10-16 NOTE — H&P (Signed)
Jacob Huber  DOB: 02/11/1945 Married / Language: English / Race: White Male  Date of Admission:  10/17/2011  Chief Complaint:  Right Hip Pain  History of Present Illness The patient is a 66 year old male who comes in for a preoperative History and Physical. The patient is scheduled for a right total hip arthroplasty to be performed by Dr. Gus Rankin. [name], MD at [facility] on 10/17/2011. The patient is a 66 year old male who presents with a hip problem. The patient is here today for his right hip.The patient reports right lateral hip and right anterior hip problems including pain symptoms that have been present for 10 year(s). The symptoms began without any known injury. Note for "Hip problem": He feels it may be due to compensating for a bad knee on the left side. He states the right hip is bothering him at all times. Pain is in the groin, lateral hip radiating down the thigh. Occasionally it will get to his knee but it does not really bother his knee much. He is not having any back pain. He is not having any lower extremity weakness or paresthesia associated with this. He feels as though the hip is definitely limiting what he can and can not do. He does get pain at night. The left knee has bothered him also. It is not as problematic as the right hip. He attributes a lot of his right hip problems to his left knee. He had surgery on that knee many years ago. He said he has favored it for many years and thus has been putting more stress on the right hip. He is at a stage now where the hip though is bothering him so much that he feels like he needs to do something about it. He is ready to proceed with surgery. They have been treated conservatively in the past for the above stated problem and despite conservative measures, they continue to have progressive pain and severe functional limitations and dysfunction. They have failed non-operative management. It is felt that they would  benefit from undergoing total joint replacement. Risks and benefits of the procedure have been discussed with the patient and they elect to proceed with surgery. There are no active contraindications to surgery such as ongoing infection or rapidly progressive neurological disease.  Problem List/Past Medical Osteoarthritis, Hip (715.35) Measles Mumps  Allergies No Known Drug Allergies. 08/02/2011   Family History Heart Disease. mother Osteoarthritis. mother   Social History Living situation. live with spouse Marital status. married Illicit drug use. no Drug/Alcohol Rehab (Previously). no Exercise. Exercises weekly; does other Tobacco use. former smoker; smoke(d) 1 pack(s) per day Tobacco / smoke exposure. no Number of flights of stairs before winded. 2-3 Pain Contract. no Current work status. working full time Drug/Alcohol Rehab (Currently). no Alcohol use. former drinker [employer]. Healthcare POA Post-Surgical Plans. Plan is to go home.   Medication History Cymbalta (60MG  Capsule DR Part, Oral) Active. Multivitamins ( Oral) Active. TraMADol HCl (50MG  Tablet, 1-2 PO Q 6HRS PRN PAIN, Taken starting 12/11/2008) Active. Aspirin EC (325MG  Tablet DR, Oral) Active. Aleve (220MG  Tablet, Oral) Active.   Past Surgical History Sinus Surgery. Date: 2007. Appendectomy. Date: 24. Arthroscopy of Knee. Date: 71. left  Review of Systems General:Not Present- Chills, Fever, Night Sweats, Fatigue, Weight Gain, Weight Loss and Memory Loss. Skin:Not Present- Hives, Itching, Rash, Eczema and Lesions. HEENT:Not Present- Tinnitus, Headache, Double Vision, Visual Loss, Hearing Loss and Dentures. Respiratory:Not Present- Shortness of breath with  exertion, Shortness of breath at rest, Allergies, Coughing up blood and Chronic Cough. Cardiovascular:Not Present- Chest Pain, Racing/skipping heartbeats, Difficulty Breathing Lying Down, Murmur, Swelling and  Palpitations. Gastrointestinal:Not Present- Bloody Stool, Heartburn, Abdominal Pain, Vomiting, Nausea, Constipation, Diarrhea, Difficulty Swallowing, Jaundice and Loss of appetitie. Male Genitourinary:Not Present- Urinary frequency, Blood in Urine, Weak urinary stream, Discharge, Flank Pain, Incontinence, Painful Urination, Urgency, Urinary Retention and Urinating at Night. Musculoskeletal:Not Present- Muscle Weakness, Muscle Pain, Joint Swelling, Joint Pain, Back Pain, Morning Stiffness and Spasms. Neurological:Not Present- Tremor, Dizziness, Blackout spells, Paralysis, Difficulty with balance and Weakness. Psychiatric:Not Present- Insomnia.   Vitals Weight: 240 lb Height: 72 in Body Surface Area: 2.35 m Body Mass Index: 32.55 kg/m Pulse: 88 (Regular) Resp.: 16 (Unlabored) BP: 138/86 (Sitting, Right Arm, Standard)    Physical Exam The physical exam findings are as follows:  Note: Patient is a 66 year old male with conitnued hip pain. Patient is accompanied today by his wife.   General Mental Status - Alert, cooperative and good historian. General Appearance- pleasant. Not in acute distress. Orientation- Oriented X3. Build & Nutrition- Well nourished and Well developed.   Head and Neck Head- normocephalic, atraumatic . Neck Global Assessment- supple. no bruit auscultated on the right and no bruit auscultated on the left.   Eye Pupil- Bilateral- Regular and Round. Note: wears glasses Motion- Bilateral- EOMI.   Chest and Lung Exam Auscultation: Breath sounds:- clear at anterior chest wall and - clear at posterior chest wall. Adventitious sounds:- No Adventitious sounds.   Cardiovascular Auscultation:Rhythm- Regular rate and rhythm. Heart Sounds- S1 WNL and S2 WNL. Murmurs & Other Heart Sounds:Auscultation of the heart reveals - No Murmurs.   Abdomen Palpation/Percussion:Tenderness- Abdomen is non-tender to palpation.  Rigidity (guarding)- Abdomen is soft. Auscultation:Auscultation of the abdomen reveals - Bowel sounds normal.   Male Genitourinary Not done, not pertinent to present illness  Musculoskeletal  Well developed male alert and oriented in no apparent distress. Evaluation of his left hip normal range of motion with no discomfort. Right hip flexion 90, no internal rotation, about 5 external rotation, about 20 abduction. Right knee exam is normal. Left knee well healed previous medial arthrotomy. His range of motion is 5 to 125 with moderate crepitus on range of motion. No jointline tenderness or instability noted.  RADIOGRAPHS: AP pelvis and lateral of the right hip show severe endstage arthritis of the right hip bone on bone throughout with large osteophytes and subchondral cysts.  Assessment & Plan Osteoarthritis, Hip (715.35) Impression: Right Hip  Note: Patient is for a right total hip replacement by Dr. Lequita Halt.  Plan is to go home after the hospital stay.  PCP - Dr. Joselyn Arrow  Signed electronically by Roberts Gaudy, PA-C

## 2011-10-17 ENCOUNTER — Encounter (HOSPITAL_COMMUNITY)
Admission: RE | Disposition: A | Discharge status: Home Health | Payer: Self-pay | Source: Ambulatory Visit | Attending: Orthopedic Surgery

## 2011-10-17 ENCOUNTER — Encounter (HOSPITAL_COMMUNITY): Payer: Self-pay | Admitting: Orthopedic Surgery

## 2011-10-17 ENCOUNTER — Ambulatory Visit (HOSPITAL_COMMUNITY): Payer: BC Managed Care – PPO | Admitting: Anesthesiology

## 2011-10-17 ENCOUNTER — Encounter (HOSPITAL_COMMUNITY): Payer: Self-pay | Admitting: Anesthesiology

## 2011-10-17 ENCOUNTER — Ambulatory Visit (HOSPITAL_COMMUNITY): Payer: BC Managed Care – PPO

## 2011-10-17 ENCOUNTER — Inpatient Hospital Stay (HOSPITAL_COMMUNITY)
Admission: RE | Admit: 2011-10-17 | Discharge: 2011-10-20 | DRG: 818 | Disposition: A | Discharge status: Home Health | Payer: BC Managed Care – PPO | Source: Ambulatory Visit | Attending: Orthopedic Surgery | Admitting: Orthopedic Surgery

## 2011-10-17 ENCOUNTER — Encounter (HOSPITAL_COMMUNITY): Payer: Self-pay | Admitting: *Deleted

## 2011-10-17 DIAGNOSIS — M1610 Unilateral primary osteoarthritis, unspecified hip: Principal | ICD-10-CM | POA: Diagnosis present

## 2011-10-17 DIAGNOSIS — F329 Major depressive disorder, single episode, unspecified: Secondary | ICD-10-CM | POA: Diagnosis present

## 2011-10-17 DIAGNOSIS — F3289 Other specified depressive episodes: Secondary | ICD-10-CM | POA: Diagnosis present

## 2011-10-17 DIAGNOSIS — D62 Acute posthemorrhagic anemia: Secondary | ICD-10-CM | POA: Diagnosis not present

## 2011-10-17 DIAGNOSIS — M169 Osteoarthritis of hip, unspecified: Secondary | ICD-10-CM | POA: Diagnosis present

## 2011-10-17 DIAGNOSIS — E669 Obesity, unspecified: Secondary | ICD-10-CM | POA: Diagnosis present

## 2011-10-17 DIAGNOSIS — Z96649 Presence of unspecified artificial hip joint: Secondary | ICD-10-CM

## 2011-10-17 DIAGNOSIS — F411 Generalized anxiety disorder: Secondary | ICD-10-CM | POA: Diagnosis present

## 2011-10-17 HISTORY — PX: TOTAL HIP ARTHROPLASTY: SHX124

## 2011-10-17 LAB — TYPE AND SCREEN: ABO/RH(D): A POS

## 2011-10-17 SURGERY — ARTHROPLASTY, HIP, TOTAL,POSTERIOR APPROACH
Anesthesia: Spinal | Site: Hip | Laterality: Right | Wound class: Clean

## 2011-10-17 MED ORDER — POLYETHYLENE GLYCOL 3350 17 G PO PACK: 17.0000 g | PACK | Freq: Every day | ORAL | Status: DC | PRN

## 2011-10-17 MED ORDER — RIVAROXABAN 10 MG PO TABS
10.0000 mg | ORAL_TABLET | Freq: Every day | ORAL | Status: DC
  Administered 2011-10-18 – 2011-10-20 (×3): 10 mg via ORAL
  Filled 2011-10-17 (×4): qty 1

## 2011-10-17 MED ORDER — 0.9 % SODIUM CHLORIDE (POUR BTL) OPTIME
TOPICAL | Status: DC | PRN
  Administered 2011-10-17: 1000 mL

## 2011-10-17 MED ORDER — BUPIVACAINE HCL 0.75 % IJ SOLN
INTRAMUSCULAR | Status: DC | PRN
  Administered 2011-10-17: 15 mg via INTRATHECAL

## 2011-10-17 MED ORDER — TRAMADOL HCL 50 MG PO TABS: 50.0000 mg | ORAL_TABLET | Freq: Four times a day (QID) | ORAL | Status: DC | PRN

## 2011-10-17 MED ORDER — BUPIVACAINE LIPOSOME 1.3 % IJ SUSP
20.0000 mL | INTRAMUSCULAR | Status: AC
  Administered 2011-10-17: 20 mL
  Filled 2011-10-17: qty 20

## 2011-10-17 MED ORDER — SODIUM CHLORIDE 0.9 % IJ SOLN
INTRAMUSCULAR | Status: DC | PRN
  Administered 2011-10-17: 50 mL

## 2011-10-17 MED ORDER — ALUM & MAG HYDROXIDE-SIMETH 200-200-20 MG/5ML PO SUSP
30.0000 mL | ORAL | Status: DC | PRN
  Administered 2011-10-17 – 2011-10-18 (×2): 30 mL via ORAL
  Filled 2011-10-17 (×2): qty 30

## 2011-10-17 MED ORDER — MENTHOL 3 MG MT LOZG
1.0000 | LOZENGE | OROMUCOSAL | Status: DC | PRN
  Filled 2011-10-17: qty 9

## 2011-10-17 MED ORDER — EPHEDRINE SULFATE 50 MG/ML IJ SOLN
INTRAMUSCULAR | Status: DC | PRN
  Administered 2011-10-17: 5 mg via INTRAVENOUS

## 2011-10-17 MED ORDER — PHENYLEPHRINE HCL 10 MG/ML IJ SOLN
INTRAMUSCULAR | Status: DC | PRN
  Administered 2011-10-17 (×2): 40 ug via INTRAVENOUS
  Administered 2011-10-17: 80 ug via INTRAVENOUS

## 2011-10-17 MED ORDER — MIDAZOLAM HCL 2 MG/2ML IJ SOLN
INTRAMUSCULAR | Status: AC
  Filled 2011-10-17: qty 2

## 2011-10-17 MED ORDER — OXYCODONE HCL 5 MG PO TABS: 5.0000 mg | ORAL_TABLET | Freq: Once | ORAL | Status: DC | PRN

## 2011-10-17 MED ORDER — ACETAMINOPHEN 10 MG/ML IV SOLN
1000.0000 mg | Freq: Once | INTRAVENOUS | Status: AC
  Administered 2011-10-17: 1000 mg via INTRAVENOUS

## 2011-10-17 MED ORDER — FENTANYL CITRATE 0.05 MG/ML IJ SOLN
INTRAMUSCULAR | Status: DC | PRN
  Administered 2011-10-17: 100 ug via INTRAVENOUS
  Administered 2011-10-17: 50 ug via INTRAVENOUS

## 2011-10-17 MED ORDER — MIDAZOLAM HCL 5 MG/5ML IJ SOLN
INTRAMUSCULAR | Status: DC | PRN
  Administered 2011-10-17: 2 mg via INTRAVENOUS

## 2011-10-17 MED ORDER — LACTATED RINGERS IV SOLN: INTRAVENOUS | Status: DC

## 2011-10-17 MED ORDER — METOCLOPRAMIDE HCL 5 MG/ML IJ SOLN: 5.0000 mg | Freq: Three times a day (TID) | INTRAMUSCULAR | Status: DC | PRN

## 2011-10-17 MED ORDER — ACETAMINOPHEN 650 MG RE SUPP: 650.0000 mg | Freq: Four times a day (QID) | RECTAL | Status: DC | PRN

## 2011-10-17 MED ORDER — FLEET ENEMA 7-19 GM/118ML RE ENEM: 1.0000 | ENEMA | Freq: Once | RECTAL | Status: AC | PRN

## 2011-10-17 MED ORDER — HYDROMORPHONE HCL PF 1 MG/ML IJ SOLN: 0.2500 mg | INTRAMUSCULAR | Status: DC | PRN

## 2011-10-17 MED ORDER — ONDANSETRON HCL 4 MG PO TABS: 4.0000 mg | ORAL_TABLET | Freq: Four times a day (QID) | ORAL | Status: DC | PRN

## 2011-10-17 MED ORDER — PHENOL 1.4 % MT LIQD
1.0000 | OROMUCOSAL | Status: DC | PRN
  Filled 2011-10-17: qty 177

## 2011-10-17 MED ORDER — ACETAMINOPHEN 325 MG PO TABS: 650.0000 mg | ORAL_TABLET | Freq: Four times a day (QID) | ORAL | Status: DC | PRN

## 2011-10-17 MED ORDER — ACETAMINOPHEN 10 MG/ML IV SOLN
1000.0000 mg | Freq: Four times a day (QID) | INTRAVENOUS | Status: AC
  Administered 2011-10-17 – 2011-10-18 (×4): 1000 mg via INTRAVENOUS
  Filled 2011-10-17 (×6): qty 100

## 2011-10-17 MED ORDER — DOCUSATE SODIUM 100 MG PO CAPS
100.0000 mg | ORAL_CAPSULE | Freq: Two times a day (BID) | ORAL | Status: DC
  Administered 2011-10-17 – 2011-10-20 (×6): 100 mg via ORAL

## 2011-10-17 MED ORDER — ACETAMINOPHEN 10 MG/ML IV SOLN
INTRAVENOUS | Status: AC
  Filled 2011-10-17: qty 100

## 2011-10-17 MED ORDER — METHOCARBAMOL 500 MG PO TABS
500.0000 mg | ORAL_TABLET | Freq: Four times a day (QID) | ORAL | Status: DC | PRN
  Administered 2011-10-18 – 2011-10-20 (×7): 500 mg via ORAL
  Filled 2011-10-17 (×7): qty 1

## 2011-10-17 MED ORDER — PROMETHAZINE HCL 25 MG/ML IJ SOLN: 6.2500 mg | INTRAMUSCULAR | Status: DC | PRN

## 2011-10-17 MED ORDER — DIPHENHYDRAMINE HCL 12.5 MG/5ML PO ELIX: 12.5000 mg | ORAL_SOLUTION | ORAL | Status: DC | PRN

## 2011-10-17 MED ORDER — DULOXETINE HCL 60 MG PO CPEP
60.0000 mg | ORAL_CAPSULE | Freq: Every day | ORAL | Status: DC
  Administered 2011-10-18 – 2011-10-20 (×3): 60 mg via ORAL
  Filled 2011-10-17 (×4): qty 1

## 2011-10-17 MED ORDER — SODIUM CHLORIDE 0.9 % IV SOLN: INTRAVENOUS | Status: DC

## 2011-10-17 MED ORDER — PROPOFOL 10 MG/ML IV EMUL
INTRAVENOUS | Status: DC | PRN
  Administered 2011-10-17: 100 ug/kg/min via INTRAVENOUS

## 2011-10-17 MED ORDER — LACTATED RINGERS IV SOLN
INTRAVENOUS | Status: DC | PRN
  Administered 2011-10-17 (×3): via INTRAVENOUS

## 2011-10-17 MED ORDER — CEFAZOLIN SODIUM-DEXTROSE 2-3 GM-% IV SOLR
2.0000 g | Freq: Four times a day (QID) | INTRAVENOUS | Status: AC
  Administered 2011-10-17 – 2011-10-18 (×2): 2 g via INTRAVENOUS
  Filled 2011-10-17 (×2): qty 50

## 2011-10-17 MED ORDER — ONDANSETRON HCL 4 MG/2ML IJ SOLN: 4.0000 mg | Freq: Four times a day (QID) | INTRAMUSCULAR | Status: DC | PRN

## 2011-10-17 MED ORDER — DEXTROSE 5 % IV SOLN
3.0000 g | INTRAVENOUS | Status: AC
  Administered 2011-10-17: 2 g via INTRAVENOUS
  Filled 2011-10-17: qty 3000

## 2011-10-17 MED ORDER — DEXAMETHASONE SODIUM PHOSPHATE 10 MG/ML IJ SOLN
INTRAMUSCULAR | Status: DC | PRN
  Administered 2011-10-17: 10 mg via INTRAVENOUS

## 2011-10-17 MED ORDER — ONDANSETRON HCL 4 MG/2ML IJ SOLN
INTRAMUSCULAR | Status: DC | PRN
  Administered 2011-10-17: 4 mg via INTRAVENOUS

## 2011-10-17 MED ORDER — ACETAMINOPHEN 10 MG/ML IV SOLN: 1000.0000 mg | Freq: Once | INTRAVENOUS | Status: DC | PRN

## 2011-10-17 MED ORDER — BISACODYL 10 MG RE SUPP: 10.0000 mg | Freq: Every day | RECTAL | Status: DC | PRN

## 2011-10-17 MED ORDER — OXYCODONE HCL 5 MG/5ML PO SOLN
5.0000 mg | Freq: Once | ORAL | Status: DC | PRN
  Filled 2011-10-17: qty 5

## 2011-10-17 MED ORDER — ZOLPIDEM TARTRATE 10 MG PO TABS
10.0000 mg | ORAL_TABLET | Freq: Every evening | ORAL | Status: DC | PRN
  Administered 2011-10-18: 10 mg via ORAL
  Filled 2011-10-17: qty 1

## 2011-10-17 MED ORDER — DEXTROSE 5 % IV SOLN
500.0000 mg | Freq: Four times a day (QID) | INTRAVENOUS | Status: DC | PRN
  Administered 2011-10-17: 500 mg via INTRAVENOUS
  Filled 2011-10-17 (×2): qty 5

## 2011-10-17 MED ORDER — MORPHINE SULFATE 2 MG/ML IJ SOLN
1.0000 mg | INTRAMUSCULAR | Status: DC | PRN
  Administered 2011-10-17 – 2011-10-18 (×10): 2 mg via INTRAVENOUS
  Filled 2011-10-17 (×10): qty 1

## 2011-10-17 MED ORDER — SODIUM CHLORIDE 0.9 % IV SOLN
INTRAVENOUS | Status: DC
  Administered 2011-10-17 – 2011-10-18 (×3): via INTRAVENOUS

## 2011-10-17 MED ORDER — OXYCODONE HCL 5 MG PO TABS
5.0000 mg | ORAL_TABLET | ORAL | Status: DC | PRN
  Administered 2011-10-17 – 2011-10-19 (×11): 10 mg via ORAL
  Filled 2011-10-17 (×11): qty 2

## 2011-10-17 MED ORDER — METOCLOPRAMIDE HCL 10 MG PO TABS: 5.0000 mg | ORAL_TABLET | Freq: Three times a day (TID) | ORAL | Status: DC | PRN

## 2011-10-17 MED ORDER — MEPERIDINE HCL 50 MG/ML IJ SOLN: 6.2500 mg | INTRAMUSCULAR | Status: DC | PRN

## 2011-10-17 MED ORDER — CEFAZOLIN SODIUM-DEXTROSE 2-3 GM-% IV SOLR
INTRAVENOUS | Status: AC
  Filled 2011-10-17: qty 50

## 2011-10-17 MED ORDER — CHLORHEXIDINE GLUCONATE 4 % EX LIQD
60.0000 mL | Freq: Once | CUTANEOUS | Status: DC
  Filled 2011-10-17: qty 60

## 2011-10-17 SURGICAL SUPPLY — 49 items
BAG ZIPLOCK 12X15 (MISCELLANEOUS) ×2 IMPLANT
BIT DRILL 2.8X128 (BIT) ×2 IMPLANT
BLADE EXTENDED COATED 6.5IN (ELECTRODE) ×2 IMPLANT
BLADE SAW SAG 73X25 THK (BLADE) ×1
BLADE SAW SGTL 73X25 THK (BLADE) ×1 IMPLANT
CLOTH BEACON ORANGE TIMEOUT ST (SAFETY) ×2 IMPLANT
DECANTER SPIKE VIAL GLASS SM (MISCELLANEOUS) ×2 IMPLANT
DRAPE INCISE IOBAN 66X45 STRL (DRAPES) ×2 IMPLANT
DRAPE ORTHO SPLIT 77X108 STRL (DRAPES) ×2
DRAPE POUCH INSTRU U-SHP 10X18 (DRAPES) ×2 IMPLANT
DRAPE SURG ORHT 6 SPLT 77X108 (DRAPES) ×2 IMPLANT
DRAPE U-SHAPE 47X51 STRL (DRAPES) ×2 IMPLANT
DRSG ADAPTIC 3X8 NADH LF (GAUZE/BANDAGES/DRESSINGS) ×2 IMPLANT
DRSG MEPILEX BORDER 4X4 (GAUZE/BANDAGES/DRESSINGS) ×2 IMPLANT
DRSG MEPILEX BORDER 4X8 (GAUZE/BANDAGES/DRESSINGS) ×2 IMPLANT
DURAPREP 26ML APPLICATOR (WOUND CARE) ×2 IMPLANT
ELECT REM PT RETURN 9FT ADLT (ELECTROSURGICAL) ×2
ELECTRODE REM PT RTRN 9FT ADLT (ELECTROSURGICAL) ×1 IMPLANT
EVACUATOR 1/8 PVC DRAIN (DRAIN) ×2 IMPLANT
FACESHIELD LNG OPTICON STERILE (SAFETY) ×8 IMPLANT
GLOVE BIO SURGEON STRL SZ8 (GLOVE) ×2 IMPLANT
GLOVE BIOGEL PI IND STRL 8 (GLOVE) ×2 IMPLANT
GLOVE BIOGEL PI INDICATOR 8 (GLOVE) ×2
GLOVE ECLIPSE 8.0 STRL XLNG CF (GLOVE) ×2 IMPLANT
GLOVE SURG SS PI 6.5 STRL IVOR (GLOVE) ×4 IMPLANT
GOWN STRL NON-REIN LRG LVL3 (GOWN DISPOSABLE) ×4 IMPLANT
GOWN STRL REIN XL XLG (GOWN DISPOSABLE) ×2 IMPLANT
IMMOBILIZER KNEE 20 (SOFTGOODS)
IMMOBILIZER KNEE 20 THIGH 36 (SOFTGOODS) IMPLANT
KIT BASIN OR (CUSTOM PROCEDURE TRAY) ×2 IMPLANT
MANIFOLD NEPTUNE II (INSTRUMENTS) ×2 IMPLANT
NDL SAFETY ECLIPSE 18X1.5 (NEEDLE) ×1 IMPLANT
NEEDLE HYPO 18GX1.5 SHARP (NEEDLE) ×1
NS IRRIG 1000ML POUR BTL (IV SOLUTION) ×2 IMPLANT
PACK TOTAL JOINT (CUSTOM PROCEDURE TRAY) ×2 IMPLANT
PASSER SUT SWANSON 36MM LOOP (INSTRUMENTS) ×2 IMPLANT
POSITIONER SURGICAL ARM (MISCELLANEOUS) ×2 IMPLANT
SPONGE GAUZE 4X4 12PLY (GAUZE/BANDAGES/DRESSINGS) ×2 IMPLANT
STRIP CLOSURE SKIN 1/2X4 (GAUZE/BANDAGES/DRESSINGS) ×4 IMPLANT
SUT ETHIBOND NAB CT1 #1 30IN (SUTURE) ×4 IMPLANT
SUT MNCRL AB 4-0 PS2 18 (SUTURE) ×2 IMPLANT
SUT VIC AB 2-0 CT1 27 (SUTURE) ×3
SUT VIC AB 2-0 CT1 TAPERPNT 27 (SUTURE) ×3 IMPLANT
SUT VLOC 180 0 24IN GS25 (SUTURE) ×4 IMPLANT
SYR 50ML LL SCALE MARK (SYRINGE) ×2 IMPLANT
TOWEL OR 17X26 10 PK STRL BLUE (TOWEL DISPOSABLE) ×4 IMPLANT
TOWEL OR NON WOVEN STRL DISP B (DISPOSABLE) ×2 IMPLANT
TRAY FOLEY CATH 14FRSI W/METER (CATHETERS) ×2 IMPLANT
WATER STERILE IRR 1500ML POUR (IV SOLUTION) ×2 IMPLANT

## 2011-10-17 NOTE — H&P (View-Only) (Signed)
Jacob Huber  DOB: 03/26/1945 Married / Language: English / Race: White Male  Date of Admission:  10/17/2011  Chief Complaint:  Right Hip Pain  History of Present Illness The patient is a 65 year old male who comes in for a preoperative History and Physical. The patient is scheduled for a right total hip arthroplasty to be performed by Dr. Frank V. [name], MD at [facility] on 10/17/2011. The patient is a 65 year old male who presents with a hip problem. The patient is here today for his right hip.The patient reports right lateral hip and right anterior hip problems including pain symptoms that have been present for 10 year(s). The symptoms began without any known injury. Note for "Hip problem": He feels it may be due to compensating for a bad knee on the left side. He states the right hip is bothering him at all times. Pain is in the groin, lateral hip radiating down the thigh. Occasionally it will get to his knee but it does not really bother his knee much. He is not having any back pain. He is not having any lower extremity weakness or paresthesia associated with this. He feels as though the hip is definitely limiting what he can and can not do. He does get pain at night. The left knee has bothered him also. It is not as problematic as the right hip. He attributes a lot of his right hip problems to his left knee. He had surgery on that knee many years ago. He said he has favored it for many years and thus has been putting more stress on the right hip. He is at a stage now where the hip though is bothering him so much that he feels like he needs to do something about it. He is ready to proceed with surgery. They have been treated conservatively in the past for the above stated problem and despite conservative measures, they continue to have progressive pain and severe functional limitations and dysfunction. They have failed non-operative management. It is felt that they would  benefit from undergoing total joint replacement. Risks and benefits of the procedure have been discussed with the patient and they elect to proceed with surgery. There are no active contraindications to surgery such as ongoing infection or rapidly progressive neurological disease.  Problem List/Past Medical Osteoarthritis, Hip (715.35) Measles Mumps  Allergies No Known Drug Allergies. 08/02/2011   Family History Heart Disease. mother Osteoarthritis. mother   Social History Living situation. live with spouse Marital status. married Illicit drug use. no Drug/Alcohol Rehab (Previously). no Exercise. Exercises weekly; does other Tobacco use. former smoker; smoke(d) 1 pack(s) per day Tobacco / smoke exposure. no Number of flights of stairs before winded. 2-3 Pain Contract. no Current work status. working full time Drug/Alcohol Rehab (Currently). no Alcohol use. former drinker Advance Directives. Healthcare POA Post-Surgical Plans. Plan is to go home.   Medication History Cymbalta (60MG Capsule DR Part, Oral) Active. Multivitamins ( Oral) Active. TraMADol HCl (50MG Tablet, 1-2 PO Q 6HRS PRN PAIN, Taken starting 12/11/2008) Active. Aspirin EC (325MG Tablet DR, Oral) Active. Aleve (220MG Tablet, Oral) Active.   Past Surgical History Sinus Surgery. Date: 2007. Appendectomy. Date: 1970. Arthroscopy of Knee. Date: 1976. left  Review of Systems General:Not Present- Chills, Fever, Night Sweats, Fatigue, Weight Gain, Weight Loss and Memory Loss. Skin:Not Present- Hives, Itching, Rash, Eczema and Lesions. HEENT:Not Present- Tinnitus, Headache, Double Vision, Visual Loss, Hearing Loss and Dentures. Respiratory:Not Present- Shortness of breath with   exertion, Shortness of breath at rest, Allergies, Coughing up blood and Chronic Cough. Cardiovascular:Not Present- Chest Pain, Racing/skipping heartbeats, Difficulty Breathing Lying Down, Murmur, Swelling and  Palpitations. Gastrointestinal:Not Present- Bloody Stool, Heartburn, Abdominal Pain, Vomiting, Nausea, Constipation, Diarrhea, Difficulty Swallowing, Jaundice and Loss of appetitie. Male Genitourinary:Not Present- Urinary frequency, Blood in Urine, Weak urinary stream, Discharge, Flank Pain, Incontinence, Painful Urination, Urgency, Urinary Retention and Urinating at Night. Musculoskeletal:Not Present- Muscle Weakness, Muscle Pain, Joint Swelling, Joint Pain, Back Pain, Morning Stiffness and Spasms. Neurological:Not Present- Tremor, Dizziness, Blackout spells, Paralysis, Difficulty with balance and Weakness. Psychiatric:Not Present- Insomnia.   Vitals Weight: 240 lb Height: 72 in Body Surface Area: 2.35 m Body Mass Index: 32.55 kg/m Pulse: 88 (Regular) Resp.: 16 (Unlabored) BP: 138/86 (Sitting, Right Arm, Standard)    Physical Exam The physical exam findings are as follows:  Note: Patient is a 65 year old male with conitnued hip pain. Patient is accompanied today by his wife.   General Mental Status - Alert, cooperative and good historian. General Appearance- pleasant. Not in acute distress. Orientation- Oriented X3. Build & Nutrition- Well nourished and Well developed.   Head and Neck Head- normocephalic, atraumatic . Neck Global Assessment- supple. no bruit auscultated on the right and no bruit auscultated on the left.   Eye Pupil- Bilateral- Regular and Round. Note: wears glasses Motion- Bilateral- EOMI.   Chest and Lung Exam Auscultation: Breath sounds:- clear at anterior chest wall and - clear at posterior chest wall. Adventitious sounds:- No Adventitious sounds.   Cardiovascular Auscultation:Rhythm- Regular rate and rhythm. Heart Sounds- S1 WNL and S2 WNL. Murmurs & Other Heart Sounds:Auscultation of the heart reveals - No Murmurs.   Abdomen Palpation/Percussion:Tenderness- Abdomen is non-tender to palpation.  Rigidity (guarding)- Abdomen is soft. Auscultation:Auscultation of the abdomen reveals - Bowel sounds normal.   Male Genitourinary Not done, not pertinent to present illness  Musculoskeletal  Well developed male alert and oriented in no apparent distress. Evaluation of his left hip normal range of motion with no discomfort. Right hip flexion 90, no internal rotation, about 5 external rotation, about 20 abduction. Right knee exam is normal. Left knee well healed previous medial arthrotomy. His range of motion is 5 to 125 with moderate crepitus on range of motion. No jointline tenderness or instability noted.  RADIOGRAPHS: AP pelvis and lateral of the right hip show severe endstage arthritis of the right hip bone on bone throughout with large osteophytes and subchondral cysts.  Assessment & Plan Osteoarthritis, Hip (715.35) Impression: Right Hip  Note: Patient is for a right total hip replacement by Dr. [name].  Plan is to go home after the hospital stay.  PCP - Dr. Eve Knapp  Signed electronically by DREW L Ameria Sanjurjo, PA-C 

## 2011-10-17 NOTE — Anesthesia Preprocedure Evaluation (Addendum)
Anesthesia Evaluation  Patient identified by MRN, date of birth, ID band Patient awake    Reviewed: Allergy & Precautions, H&P , NPO status , Patient's Chart, lab work & pertinent test results  History of Anesthesia Complications Negative for: history of anesthetic complications  Airway Mallampati: II TM Distance: >3 FB Neck ROM: Full    Dental  (+) Teeth Intact and Dental Advisory Given   Pulmonary former smoker,  breath sounds clear to auscultation  Pulmonary exam normal       Cardiovascular - CAD, - Past MI, - Peripheral Vascular Disease and - CHF Rhythm:Regular Rate:Normal     Neuro/Psych Anxiety Depression    GI/Hepatic negative GI ROS, Neg liver ROS,   Endo/Other  negative endocrine ROS  Renal/GU negative Renal ROS     Musculoskeletal  (+) Arthritis -, Osteoarthritis,    Abdominal (+) + obese,   Peds  Hematology negative hematology ROS (+)   Anesthesia Other Findings   Reproductive/Obstetrics                         Anesthesia Physical Anesthesia Plan  ASA: II  Anesthesia Plan: Spinal   Post-op Pain Management:    Induction:   Airway Management Planned: Simple Face Mask  Additional Equipment:   Intra-op Plan:   Post-operative Plan:   Informed Consent: I have reviewed the patients History and Physical, chart, labs and discussed the procedure including the risks, benefits and alternatives for the proposed anesthesia with the patient or authorized representative who has indicated his/her understanding and acceptance.   Dental advisory given  Plan Discussed with: CRNA and Surgeon  Anesthesia Plan Comments:         Anesthesia Quick Evaluation

## 2011-10-17 NOTE — Op Note (Signed)
Pre-operative diagnosis- Osteoarthritis Right hip  Post-operative diagnosis- Osteoarthritis  Right hip  Procedure-  RightTotal Hip Arthroplasty  Surgeon- Jacob Rankin. Eleyna Brugh, MD  Assistant- Leilani Able, PA-C   Anesthesia  Spinal  EBL- 300   Drain Hemovac   Complication- None  Condition-PACU - hemodynamically stable.   Brief Clinical Note- Jacob Huber is a 66 y.o. male with end stage arthritis of his right hip with progressively worsening pain and dysfunction. Pain occurs with activity and rest including pain at night. He has tried analgesics, protected weight bearing and rest without benefit. Pain is too severe to attempt physical therapy. Radiographs demonstrate bone on bone arthritis with subchondral cyst formation and large osteophytes. He presents now for right THA.  Procedure in detail-   The patient is brought into the operating room and placed on the operating table. After successful administration of Spinal  anesthesia, the patient is placed in the  Left lateral decubitus position with the  Right side up and held in place with the hip positioner. The lower extremity is isolated from the perineum with plastic drapes and time-out is performed by the surgical team. The lower extremity is then prepped and draped in the usual sterile fashion. A short posterolateral incision is made with a ten blade through the subcutaneous tissue to the level of the fascia lata which is incised in line with the skin incision. The sciatic nerve is palpated and protected and the short external rotators and capsule are isolated from the femur. The hip is then dislocated and the center of the femoral head is marked. A trial prosthesis is placed such that the trial head corresponds to the center of the patients' native femoral head. The resection level is marked on the femoral neck and the resection is made with an oscillating saw. The femoral head is removed and femoral retractors placed to gain access to  the femoral canal.      The canal finder is passed into the femoral canal and the canal is thoroughly irrigated with sterile saline to remove the fatty contents. Axial reaming is performed to 15.5  mm, proximal reaming to 48F  and the sleeve machined to a large. A 20 F large trial sleeve is placed into the proximal femur.      The femur is then retracted anteriorly to gain acetabular exposure. Acetabular retractors are placed and the labrum and osteophytes are removed, Acetabular reaming is performed to 53  mm and a 54  mm Pinnacle acetabular shell is placed in anatomic position with excellent purchase. Additional dome screws were not needed. An apex hole eliminator is placed and the permanent 36 mm neutral + 4 Marathon liner is placed into the acetabular shell.      The trial femur is then placed into the femoral canal. The size is 20 x 15  stem with a 36 + 12  neck and a 36 + 3 head with the neck version 10 degrees beyond  the patients' native anteversion. The hip is reduced with excellent stability with full extension and full external rotation, 70 degrees flexion with 40 degrees adduction and 90 degrees internal rotation and 90 degrees of flexion with 70 degrees of internal rotation. The operative leg is placed on top of the non-operative leg and the leg lengths are found to be equal. The trials are then removed and the permanent implant of the same size is impacted into the femoral canal. The ceramic femoral head of the same size as the trial  is placed and the hip is reduced with the same stability parameters. The operative leg is again placed on top of the non-operative leg and the leg lengths are found to be equal.      The wound is then copiously irrigated with saline solution and the capsule and short external rotators are re-attached to the femur through drill holes with Ethibond suture. The fascia lata is closed over a hemovac drain with #1 vicryl suture and the fascia lata, gluteal muscles and  subcutaneous tissues are injected with Exparel 20ml diluted with saline 50ml. The subcutaneous tissues are closed with #1 and2-0 vicryl and the subcuticular layer closed with running 4-0 Monocryl. The drain is hooked to suction, incision cleaned and dried, and steri-srips and a bulky sterile dressing applied. The limb is placed into a knee immobilizer and the patient is awakened and transported to recovery in stable condition.      Please note that a surgical assistant was a medical necessity for this procedure in order to perform it in a safe and expeditious manner. The assistant was necessary to provide retraction to the vital neurovascular structures and to retract and position the limb to allow for anatomic placement of the prosthetic components.  Jacob Rankin Tavares Levinson, MD    10/17/2011, 2:18 PM

## 2011-10-17 NOTE — Anesthesia Postprocedure Evaluation (Signed)
Anesthesia Post Note  Patient: Jacob Huber  Procedure(s) Performed: Procedure(s) (LRB): TOTAL HIP ARTHROPLASTY (Right)  Anesthesia type: Spinal  Patient location: PACU  Post pain: Pain level controlled  Post assessment: Post-op Vital signs reviewed  Last Vitals: BP 110/62  Pulse 95  Temp 36.8 C  Resp 18  SpO2 97%  Post vital signs: Reviewed  Level of consciousness: sedated  Complications: No apparent anesthesia complications

## 2011-10-17 NOTE — Interval H&P Note (Signed)
History and Physical Interval Note:  10/17/2011 12:46 PM  Jacob Huber  has presented today for surgery, with the diagnosis of Osteoarthritis of the Right Hip  The various methods of treatment have been discussed with the patient and family. After consideration of risks, benefits and other options for treatment, the patient has consented to  Procedure(s) (LRB) with comments: TOTAL HIP ARTHROPLASTY (Right) as a surgical intervention .  The patient's history has been reviewed, patient examined, no change in status, stable for surgery.  I have reviewed the patient's chart and labs.  Questions were answered to the patient's satisfaction.     Loanne Drilling

## 2011-10-17 NOTE — Anesthesia Procedure Notes (Signed)
Spinal  Patient location during procedure: OR Start time: 10/17/2011 12:57 PM End time: 10/17/2011 1:00 PM Staffing Anesthesiologist: Lewie Loron R Performed by: anesthesiologist  Preanesthetic Checklist Completed: patient identified, site marked, surgical consent, pre-op evaluation, timeout performed, IV checked, risks and benefits discussed and monitors and equipment checked Spinal Block Patient position: sitting Prep: ChloraPrep Patient monitoring: heart rate, continuous pulse ox and blood pressure Location: L3-4 Injection technique: single-shot Needle Needle type: Sprotte  Needle gauge: 24 G Needle length: 9 cm Needle insertion depth: 9 cm Assessment Sensory level: T8 Additional Notes Expiration date of kit checked and confirmed. Patient tolerated procedure well, without complications.

## 2011-10-17 NOTE — Preoperative (Signed)
Beta Blockers   Reason not to administer Beta Blockers:Not Applicable 

## 2011-10-17 NOTE — Transfer of Care (Signed)
Immediate Anesthesia Transfer of Care Note  Patient: Jacob Huber  Procedure(s) Performed: Procedure(s) (LRB) with comments: TOTAL HIP ARTHROPLASTY (Right)  Patient Location: PACU  Anesthesia Type: MAC combined with regional for post-op pain  Level of Consciousness: awake, alert  and oriented  Airway & Oxygen Therapy: Patient Spontanous Breathing and Patient connected to face mask oxygen  Post-op Assessment: Report given to PACU RN and Post -op Vital signs reviewed and stable  Post vital signs: Reviewed and stable  Complications: No apparent anesthesia complications

## 2011-10-18 ENCOUNTER — Encounter (HOSPITAL_COMMUNITY): Payer: Self-pay | Admitting: Orthopedic Surgery

## 2011-10-18 LAB — CBC
HCT: 34.2 % — ABNORMAL LOW (ref 39.0–52.0)
Hemoglobin: 11.7 g/dL — ABNORMAL LOW (ref 13.0–17.0)
MCH: 28.9 pg (ref 26.0–34.0)
MCHC: 34.2 g/dL (ref 30.0–36.0)
MCV: 84.4 fL (ref 78.0–100.0)
RBC: 4.05 MIL/uL — ABNORMAL LOW (ref 4.22–5.81)

## 2011-10-18 LAB — BASIC METABOLIC PANEL
BUN: 15 mg/dL (ref 6–23)
CO2: 27 mEq/L (ref 19–32)
Calcium: 8.3 mg/dL — ABNORMAL LOW (ref 8.4–10.5)
Creatinine, Ser: 0.86 mg/dL (ref 0.50–1.35)
Glucose, Bld: 137 mg/dL — ABNORMAL HIGH (ref 70–99)

## 2011-10-18 MED ORDER — HYDROMORPHONE HCL PF 1 MG/ML IJ SOLN
0.5000 mg | INTRAMUSCULAR | Status: DC | PRN
  Administered 2011-10-18: 1 mg via INTRAVENOUS
  Administered 2011-10-19: 2 mg via INTRAVENOUS
  Administered 2011-10-19 (×4): 1 mg via INTRAVENOUS
  Filled 2011-10-18 (×5): qty 1
  Filled 2011-10-18: qty 2

## 2011-10-18 NOTE — Progress Notes (Signed)
Utilization review completed.  

## 2011-10-18 NOTE — Evaluation (Signed)
Physical Therapy Evaluation Patient Details Name: Jacob Huber MRN: [id number] DOB: [birth year] Today's Date: 10/18/2011 Time: 7829-5621 PT Time Calculation (min): 23 min  PT Assessment / Plan / Recommendation Clinical Impression  Pt s/p R THR.  Pt would benefit from acute PT services in order to improve independence with transfers and ambulation while maintaining hip precautions to prepare for d/c home with spouse and 3 dogs.    PT Assessment  Patient needs continued PT services    Follow Up Recommendations  Home health PT    Barriers to Discharge        Equipment Recommendations  None recommended by PT    Recommendations for Other Services     Frequency 7X/week    Precautions / Restrictions Precautions Precautions: Posterior Hip Precaution Comments: handout posted in room Restrictions RLE Weight Bearing: Weight bearing as tolerated   Pertinent Vitals/Pain 4/10 R hip pain, ice applied, repositioned      Mobility  Bed Mobility Bed Mobility: Supine to Sit Supine to Sit: 4: Min assist;HOB elevated;With rails Details for Bed Mobility Assistance: verbal cues for technique and hip precautions, assist for R LE Transfers Transfers: Stand to Sit;Sit to Stand Sit to Stand: 4: Min assist;With upper extremity assist;From elevated surface;From bed Stand to Sit: 4: Min assist;With upper extremity assist;To chair/3-in-1 Details for Transfer Assistance: verbal cues for safe technique and hip precautions, assist to steady upon rise and control descent Ambulation/Gait Ambulation/Gait Assistance: 4: Min guard Ambulation Distance (Feet): 60 Feet Assistive device: Rolling walker Ambulation/Gait Assistance Details: verbal cues for sequence and step length Gait Pattern: Step-to pattern;Decreased stance time - right Gait velocity: decreased    Exercises Total Joint Exercises Ankle Circles/Pumps: AROM;Both;20 reps;Supine Quad Sets: AROM;Strengthening;Both;20 reps;Supine Short Arc  Quad: AROM;Strengthening;Right;15 reps;Supine Heel Slides: AROM;Strengthening;Right;15 reps;Supine;Other (comment) (within precautions) Hip ABduction/ADduction: AROM;Strengthening;Right;15 reps;Supine   PT Diagnosis: Difficulty walking;Acute pain  PT Problem List: Decreased strength;Decreased activity tolerance;Decreased mobility;Decreased knowledge of use of DME;Decreased knowledge of precautions;Pain PT Treatment Interventions: DME instruction;Gait training;Stair training;Functional mobility training;Therapeutic activities;Therapeutic exercise;Patient/family education   PT Goals Acute Rehab PT Goals PT Goal Formulation: With patient Time For Goal Achievement: 10/25/11 Potential to Achieve Goals: Good Pt will go Supine/Side to Sit: with supervision;with HOB 0 degrees PT Goal: Supine/Side to Sit - Progress: Goal set today Pt will go Sit to Supine/Side: with supervision;with HOB 0 degrees PT Goal: Sit to Supine/Side - Progress: Goal set today Pt will go Sit to Stand: with supervision PT Goal: Sit to Stand - Progress: Goal set today Pt will go Stand to Sit: with supervision PT Goal: Stand to Sit - Progress: Goal set today Pt will Ambulate: >150 feet;with supervision;with least restrictive assistive device PT Goal: Ambulate - Progress: Goal set today Pt will Go Up / Down Stairs: 3-5 stairs;with least restrictive assistive device;with supervision PT Goal: Up/Down Stairs - Progress: Goal set today Pt will Perform Home Exercise Program: with supervision, verbal cues required/provided PT Goal: Perform Home Exercise Program - Progress: Goal set today  Visit Information  Last PT Received On: 10/18/11 Assistance Needed: +1    Subjective Data  Subjective: I'm ready.   Prior Functioning  Home Living Lives With: Spouse Type of Home: House Home Access: Stairs to enter Secretary/administrator of Steps: 3 Entrance Stairs-Rails: Left;Right Home Layout: One level Home Adaptive Equipment:  Bedside commode/3-in-1;Straight cane;Walker - rolling Prior Function Level of Independence: Independent Communication Communication: No difficulties    Cognition  Overall Cognitive Status: Appears within functional limits for tasks assessed/performed Arousal/Alertness:  Awake/alert Orientation Level: Appears intact for tasks assessed Behavior During Session: [facility] for tasks performed    Extremity/Trunk Assessment Right Upper Extremity Assessment RUE ROM/Strength/Tone: [facility] for tasks assessed Left Upper Extremity Assessment LUE ROM/Strength/Tone: [facility] for tasks assessed Right Lower Extremity Assessment RLE ROM/Strength/Tone: Deficits;Due to precautions RLE ROM/Strength/Tone Deficits: decreased hip strength Left Lower Extremity Assessment LLE ROM/Strength/Tone: WFL for tasks assessed   Balance    End of Session PT - End of Session Equipment Utilized During Treatment: Gait belt Activity Tolerance: Patient tolerated treatment well Patient left: in chair;with call bell/phone within reach  GP     Ravindra Baranek,KATHrine E 10/18/2011, 12:56 PM Pager: [phone]

## 2011-10-18 NOTE — Progress Notes (Signed)
   Subjective: 1 Day Post-Op Procedure(s) (LRB): TOTAL HIP ARTHROPLASTY (Right) Patient reports pain as mild and moderate last night but better this morning.  Patient seen in rounds with Dr. Lequita Halt. Patient is well, and has had no acute complaints or problems except for some reflux last night. We will start therapy today.  Plan is to go Home after hospital stay.  Objective: Vital signs in last 24 hours: Temp:  [97.5 F (36.4 C)-98.5 F (36.9 C)] 97.7 F (36.5 C) (09/10 0610) Pulse Rate:  [76-97] 89  (09/10 0610) Resp:  [11-20] 16  (09/10 0610) BP: (110-141)/(62-93) 127/81 mmHg (09/10 0610) SpO2:  [96 %-100 %] 97 % (09/10 0610) Weight:  [110.224 kg (243 lb)] 110.224 kg (243 lb) (09/09 1540)  Intake/Output from previous day:  Intake/Output Summary (Last 24 hours) at 10/18/11 0737 Last data filed at 10/18/11 0615  Gross per 24 hour  Intake 4621.67 ml  Output   2725 ml  Net 1896.67 ml    Intake/Output this shift:    Labs:  Basename 10/18/11 0405  HGB 11.7*    Basename 10/18/11 0405  WBC 12.5*  RBC 4.05*  HCT 34.2*  PLT 226    Basename 10/18/11 0405  NA 136  K 4.0  CL 100  CO2 27  BUN 15  CREATININE 0.86  GLUCOSE 137*  CALCIUM 8.3*   No results found for this basename: LABPT:2,INR:2 in the last 72 hours  EXAM General - Patient is Alert, Appropriate and Oriented Extremity - Neurovascular intact Sensation intact distally Dorsiflexion/Plantar flexion intact Dressing - dressing C/D/I Motor Function - intact, moving foot and toes well on exam.  Hemovac pulled without difficulty.  Past Medical History  Diagnosis Date  . Depressive disorder, not elsewhere classified   . Hip arthritis     R hip ([place] [facility] in past)  . Impaired fasting glucose   . Obesity   . GAD (generalized anxiety disorder)   . Anxiety state, unspecified   . Cardiac disease     FAM HX  . Diverticulosis of colon 2010  . Arthritis     Assessment/Plan: 1 Day Post-Op Procedure(s)  (LRB): TOTAL HIP ARTHROPLASTY (Right) Principal Problem:  *OA (osteoarthritis) of hip   Advance diet Up with therapy Discharge home with home health  DVT Prophylaxis - Xarelto Weight Bearing As Tolerated right Leg D/C Knee Immobilizer Hemovac Pulled Begin Therapy Hip Preacutions No vaccines.  Adelia Baptista 10/18/2011, 7:37 AM

## 2011-10-18 NOTE — Progress Notes (Signed)
Physical Therapy Treatment Note   10/18/11 1500  PT Visit Information  Last PT Received On 10/18/11  Assistance Needed +1  PT Time Calculation  PT Start Time 1441  PT Stop Time 1458  PT Time Calculation (min) 17 min  Subjective Data  Subjective I'm sore this afternoon.  Precautions  Precautions Posterior Hip  Precaution Comments handout posted in room  Restrictions  RLE Weight Bearing WBAT  Cognition  Overall Cognitive Status Appears within functional limits for tasks assessed/performed  Bed Mobility  Bed Mobility Supine to Sit;Sit to Supine  Supine to Sit 4: Min guard;HOB elevated;With rails  Sit to Supine 4: Min guard;HOB elevated  Details for Bed Mobility Assistance verbal cues for safe technique, pt wished to attempt without assist, discussed use of sheet to assist R LE at home if needed  Transfers  Transfers Stand to Sit;Sit to Stand  Sit to Stand 4: Min guard;With upper extremity assist;From bed  Stand to Sit 4: Min guard;With upper extremity assist;To bed  Details for Transfer Assistance verbal cues for R LE forward and hand placement  Ambulation/Gait  Ambulation/Gait Assistance 4: Min guard  Ambulation Distance (Feet) 120 Feet  Assistive device Rolling walker  Ambulation/Gait Assistance Details verbal cues for sequence initially then safe RW distance  Gait Pattern Step-to pattern;Decreased stance time - right  Gait velocity decreased  PT - End of Session  Activity Tolerance Patient tolerated treatment well  Patient left in bed;with call bell/phone within reach;with family/visitor present  PT - Assessment/Plan  Comments on Treatment Session Spouse present and educated on posterior hip precautions.  Pt able to recall all 3 precautions without cues however still needs cuing for demonstrating during mobility.  Pt ambulated again in hallway this afternoon.  PT Plan Discharge plan remains appropriate;Frequency remains appropriate  Follow Up Recommendations Home health PT    Equipment Recommended None recommended by PT  Acute Rehab PT Goals  PT Goal: Supine/Side to Sit - Progress Partly met  PT Goal: Sit to Supine/Side - Progress Partly met  PT Goal: Sit to Stand - Progress Progressing toward goal  PT Goal: Stand to Sit - Progress Progressing toward goal  PT Goal: Ambulate - Progress Progressing toward goal  PT General Charges  $$ ACUTE PT VISIT 1 Procedure  PT Treatments  $Gait Training 8-22 mins    Zenovia Jarred, PT Pager: [phone]

## 2011-10-19 LAB — CBC
HCT: 33.8 % — ABNORMAL LOW (ref 39.0–52.0)
Hemoglobin: 11.3 g/dL — ABNORMAL LOW (ref 13.0–17.0)
MCH: 28.8 pg (ref 26.0–34.0)
MCHC: 33.4 g/dL (ref 30.0–36.0)
MCV: 86 fL (ref 78.0–100.0)
Platelets: 230 K/uL (ref 150–400)
RBC: 3.93 MIL/uL — ABNORMAL LOW (ref 4.22–5.81)
RDW: 13.1 % (ref 11.5–15.5)
WBC: 13.6 K/uL — ABNORMAL HIGH (ref 4.0–10.5)

## 2011-10-19 LAB — BASIC METABOLIC PANEL
BUN: 15 mg/dL (ref 6–23)
CO2: 32 mEq/L (ref 19–32)
Calcium: 8.8 mg/dL (ref 8.4–10.5)
Chloride: 97 mEq/L (ref 96–112)
Creatinine, Ser: 0.93 mg/dL (ref 0.50–1.35)
Glucose, Bld: 129 mg/dL — ABNORMAL HIGH (ref 70–99)

## 2011-10-19 MED ORDER — OXYCODONE HCL 5 MG PO TABS
10.0000 mg | ORAL_TABLET | ORAL | Status: DC | PRN
  Administered 2011-10-19 – 2011-10-20 (×8): 20 mg via ORAL
  Filled 2011-10-19 (×8): qty 4

## 2011-10-19 NOTE — Care Management Note (Signed)
    Page 1 of 2   10/20/2011     12:52:49 PM   CARE MANAGEMENT NOTE 10/20/2011  Patient:  Jacob Huber,Jacob Huber   Account Number:  [id number]  Date Initiated:  10/18/2011  Documentation initiated by:  Colleen Can  Subjective/Objective Assessment:   dx osteoarthritis right hip; total hip replacemnt     Action/Plan:   CM spoke with patient and spouse. Plans are for patient to return his home in [place], [place] where spouse will be caregiver. Already has DME. Wants [facility] for [facility] services   Anticipated DC Date:  10/20/2011   Anticipated DC Plan:  HOME W HOME HEALTH SERVICES  In-house referral  NA      DC Planning Services  CM consult      [facility] Choice  HOME HEALTH   Choice offered to / List presented to:  C-1 Patient   DME arranged  NA      DME agency  NA     HH arranged  HH-2 PT      [facility] agency  [facility]   Status of service:  Completed, signed off Medicare Important Message given?  NO (If response is "NO", the following Medicare IM given date fields will be blank) Date Medicare IM given:   Date Additional Medicare IM given:    Discharge Disposition:  HOME W HOME HEALTH SERVICES  Per UR Regulation:  Reviewed for med. necessity/level of care/duration of stay  If discussed at Long Length of Stay Meetings, dates discussed:    Comments:  10/20/2011 Raynelle Bring BSN CCM [phone] PT FOR DISCHARGE TODAY. [facility] PL[facility] WITH START DATE OF TOMORROW 10/21/2011

## 2011-10-19 NOTE — Progress Notes (Signed)
   Subjective: 2 Days Post-Op Procedure(s) (LRB): TOTAL HIP ARTHROPLASTY (Right) Patient reports pain as moderate and severe last night.  Pain got worse thru the night requiring IV med change. Also increased the oral dose. Patient seen in rounds for Dr. Lequita Halt. Patient is having problems with pain in the hip and thigh, requiring pain medications Plan is to go Home after hospital stay.  Objective: Vital signs in last 24 hours: Temp:  [97.6 F (36.4 C)-99.4 F (37.4 C)] 99.4 F (37.4 C) (09/11 0500) Pulse Rate:  [92-106] 106  (09/11 0500) Resp:  [16-20] 18  (09/11 0734) BP: (124-147)/(66-82) 147/78 mmHg (09/11 0500) SpO2:  [92 %-96 %] 93 % (09/11 0500)  Intake/Output from previous day:  Intake/Output Summary (Last 24 hours) at 10/19/11 0758 Last data filed at 10/19/11 0500  Gross per 24 hour  Intake   1600 ml  Output   1425 ml  Net    175 ml    Intake/Output this shift:    Labs:  Basename 10/19/11 0423 10/18/11 0405  HGB 11.3* 11.7*    Basename 10/19/11 0423 10/18/11 0405  WBC 13.6* 12.5*  RBC 3.93* 4.05*  HCT 33.8* 34.2*  PLT 230 226    Basename 10/19/11 0423 10/18/11 0405  NA 135 136  K 3.7 4.0  CL 97 100  CO2 32 27  BUN 15 15  CREATININE 0.93 0.86  GLUCOSE 129* 137*  CALCIUM 8.8 8.3*   No results found for this basename: LABPT:2,INR:2 in the last 72 hours  EXAM General - Patient is Alert, Appropriate and Oriented Extremity - Neurovascular intact Sensation intact distally Dorsiflexion/Plantar flexion intact No cellulitis present Dressing/Incision - clean, dry, no drainage, healing Motor Function - intact, moving foot and toes well on exam.   Past Medical History  Diagnosis Date  . Depressive disorder, not elsewhere classified   . Hip arthritis     R hip ([facility] in past)  . Impaired fasting glucose   . Obesity   . GAD (generalized anxiety disorder)   . Anxiety state, unspecified   . Cardiac disease     FAM HX  . Diverticulosis of colon  2010  . Arthritis     Assessment/Plan: 2 Days Post-Op Procedure(s) (LRB): TOTAL HIP ARTHROPLASTY (Right) Principal Problem:  *OA (osteoarthritis) of hip   Plan for discharge tomorrow Discharge home with home health  DVT Prophylaxis - Xarelto Weight Bearing As Tolerated right Leg Increased PO pain pill. Encourage therapy  Patrica Duel 10/19/2011, 7:58 AM

## 2011-10-19 NOTE — Progress Notes (Signed)
Physical Therapy Treatment Note   10/19/11 1541  PT Visit Information  Last PT Received On 10/19/11  Assistance Needed +1  PT Time Calculation  PT Start Time 1502  PT Stop Time 1517  PT Time Calculation (min) 15 min  Subjective Data  Subjective pt agreeable to exercises but declined ambulation due to pain  Precautions  Precautions Posterior Hip  Restrictions  RLE Weight Bearing WBAT  Cognition  Overall Cognitive Status Appears within functional limits for tasks assessed/performed  Total Joint Exercises  Ankle Circles/Pumps AROM;Both;20 reps;Supine  Quad Sets AROM;Strengthening;Both;20 reps;Supine  Short Arc Quad AROM;Strengthening;Right;Supine;20 reps  Heel Slides Strengthening;Right;Supine;Other (comment);AAROM;20 reps (with sheet within precautions)  Hip ABduction/ADduction Strengthening;Right;Supine;20 reps;AAROM (with shhet)  Straight Leg Raises AAROM;Strengthening;Right;10 reps;Supine;Other (comment) (with sheet, unable to lift more than 20* hip flexion)  PT - End of Session  Activity Tolerance Patient limited by pain  Patient left in bed;with call bell/phone within reach  PT - Assessment/Plan  Comments on Treatment Session Pt continues to have pain this afternoon 7/10 even after IV meds 20 min prior to therapy session, so only performed exercises.  PT Plan Discharge plan remains appropriate;Frequency remains appropriate  Follow Up Recommendations Home health PT  Equipment Recommended None recommended by PT  Acute Rehab PT Goals  PT Goal: Perform Home Exercise Program - Progress Progressing toward goal  PT General Charges  $$ ACUTE PT VISIT 1 Procedure  PT Treatments  $Therapeutic Exercise 8-22 mins    Zenovia Jarred, PT Pager: [phone]

## 2011-10-19 NOTE — Evaluation (Signed)
Occupational Therapy Evaluation Patient Details Name: Jacob Huber MRN: [id number] DOB: Jan 24, 1946 Today's Date: 10/19/2011 Time: 7829-5621 OT Time Calculation (min): 24 min  OT Assessment / Plan / Recommendation Clinical Impression  Pt is a 65 yo male POD 2 RTHR. Skilled OT recommended to maximize independence with BADLs to supervision level in prep for safe d/c home. Pt will likely NOT need f/u.    OT Assessment  Patient needs continued OT Services    Follow Up Recommendations  No OT follow up    Barriers to Discharge      Equipment Recommendations  None recommended by OT    Recommendations for Other Services    Frequency  Min 2X/week    Precautions / Restrictions Precautions Precautions: Posterior Hip Precaution Comments: Able to recall 2/3 THP Restrictions Weight Bearing Restrictions: No RLE Weight Bearing: Weight bearing as tolerated   Pertinent Vitals/Pain Pt reported 8/10 pain in RUE. Repositioned and cold applied.    ADL  Grooming: Simulated;Set up Where Assessed - Grooming: Unsupported sitting Upper Body Bathing: Simulated;Set up Where Assessed - Upper Body Bathing: Unsupported sitting Lower Body Bathing: Simulated;Minimal assistance Where Assessed - Lower Body Bathing: Supported sit to stand Upper Body Dressing: Simulated;Set up Where Assessed - Upper Body Dressing: Unsupported sitting Lower Body Dressing: Simulated;Moderate assistance Where Assessed - Lower Body Dressing: Supported sit to Pharmacist, hospital: Mining engineer Method: Sit to Barista: Other (comment) (chair) Toileting - Clothing Manipulation and Hygiene: Performed;Min guard Where Assessed - Engineer, mining and Hygiene: Standing (using urinal) Equipment Used: Gait belt;Rolling walker ADL Comments: Pt declined to ambulate to the bathroom or practice with AE stating he was "too tired and dizzy."    OT Diagnosis:  Generalized weakness  OT Problem List: Decreased activity tolerance;Decreased safety awareness;Pain;Decreased knowledge of precautions;Decreased knowledge of use of DME or AE OT Treatment Interventions: Self-care/ADL training;Therapeutic activities;DME and/or AE instruction;Patient/family education   OT Goals Acute Rehab OT Goals OT Goal Formulation: With patient Time For Goal Achievement: 10/26/11 Potential to Achieve Goals: Good ADL Goals Pt Will Perform Grooming: with supervision;Standing at sink ADL Goal: Grooming - Progress: Progressing toward goals Pt Will Perform Lower Body Bathing: with supervision;Sit to stand from chair;Sit to stand from bed;with adaptive equipment ADL Goal: Lower Body Bathing - Progress: Goal set today Pt Will Perform Lower Body Dressing: with supervision;Sit to stand from chair;Sit to stand from bed;with adaptive equipment ADL Goal: Lower Body Dressing - Progress: Goal set today Pt Will Transfer to Toilet: with supervision;3-in-1;Ambulation;Comfort height toilet;Maintaining hip precautions ADL Goal: Toilet Transfer - Progress: Goal set today Pt Will Perform Toileting - Clothing Manipulation: Sitting on 3-in-1 or toilet;with supervision ADL Goal: Toileting - Clothing Manipulation - Progress: Goal set today Pt Will Perform Toileting - Hygiene: with supervision;Sit to stand from 3-in-1/toilet ADL Goal: Toileting - Hygiene - Progress: Goal set today Pt Will Perform Tub/Shower Transfer: Shower transfer;Ambulation;Maintaining hip precautions ADL Goal: Tub/Shower Transfer - Progress: Goal set today  Visit Information  Last OT Received On: 10/19/11 Assistance Needed: +1    Subjective Data  Subjective: I've been sleeping for awhile. Patient Stated Goal: Just want to get back to work.   Prior Functioning  Vision/Perception  Home Living Lives With: Spouse Available Help at Discharge: Family Type of Home: House Home Access: Stairs to enter Water quality scientist of Steps: 3 Entrance Stairs-Rails: Left;Right Home Layout: One level Bathroom Shower/Tub: Health visitor: Handicapped height Home Adaptive Equipment: Bedside commode/3-in-1;Walker - rolling;Straight cane;Shower  chair without back Prior Function Level of Independence: Independent Able to Take Stairs?: Yes Driving: Yes Vocation: Full time employment Communication Communication: No difficulties Dominant Hand: Right      Cognition  Overall Cognitive Status: Appears within functional limits for tasks assessed/performed Arousal/Alertness: Awake/alert Orientation Level: Appears intact for tasks assessed Behavior During Session: [facility] for tasks performed    Extremity/Trunk Assessment Right Upper Extremity Assessment RUE ROM/Strength/Tone: [facility] for tasks assessed Left Upper Extremity Assessment LUE ROM/Strength/Tone: WFL for tasks assessed   Mobility  Shoulder Instructions  Bed Mobility Sit to Supine: 4: Min assist Details for Bed Mobility Assistance: Pt required physical A to bring RLE into bed 2* pain. Cues to use LLE to self assist. Transfers Sit to Stand: 4: Min assist;With upper extremity assist;From chair/3-in-1 Stand to Sit: 4: Min assist;With upper extremity assist;With armrests;To chair/3-in-1 Details for Transfer Assistance: VCs for safe technique, hand placement. Physical A needed to rise and stabilize.       Exercise    Balance     End of Session OT - End of Session Equipment Utilized During Treatment: Gait belt Activity Tolerance: Patient limited by fatigue;Patient limited by pain Patient left: in bed;with call bell/phone within reach  GO     Eban Weick AOTR/L [phone] 10/19/2011, 3:52 PM

## 2011-10-19 NOTE — Progress Notes (Signed)
Physical Therapy Treatment Patient Details Name: Jacob Huber MRN: [id number] DOB: December 15, 1945 Today's Date: 10/19/2011 Time: 1191-4782 PT Time Calculation (min): 24 min  PT Assessment / Plan / Recommendation Comments on Treatment Session  Pt reports increased pain last night and today and slow with transfers and gait due to pain.    Follow Up Recommendations  Home health PT    Barriers to Discharge        Equipment Recommendations  None recommended by PT    Recommendations for Other Services    Frequency     Plan Discharge plan remains appropriate;Frequency remains appropriate    Precautions / Restrictions Precautions Precautions: Posterior Hip Precaution Comments: handout posted in room Restrictions RLE Weight Bearing: Weight bearing as tolerated   Pertinent Vitals/Pain 8/10 R hip pain, repositioned, ice applied, premedicated    Mobility  Bed Mobility Bed Mobility: Supine to Sit Supine to Sit: 4: Min assist;With rails;HOB elevated Details for Bed Mobility Assistance: increased time with transfer due to pain, attempted to use sheet to assist R LE however pt in so much pain that PT supported and assisted LE off bed Transfers Transfers: Stand to Sit;Sit to Stand Sit to Stand: 4: Min assist;With upper extremity assist;From bed;From elevated surface Stand to Sit: 4: Min assist;With upper extremity assist;To chair/3-in-1 Details for Transfer Assistance: verbal cues for safe technique, assist to rise and steady as well as control descent Ambulation/Gait Ambulation/Gait Assistance: 4: Min guard Ambulation Distance (Feet): 60 Feet Assistive device: Rolling walker Ambulation/Gait Assistance Details: increased time and effort with gait today, pt reporting increased pain however felt better than "staying in bed" Gait Pattern: Step-to pattern;Decreased stance time - right Gait velocity: decreased    Exercises     PT Diagnosis:    PT Problem List:   PT Treatment  Interventions:     PT Goals Acute Rehab PT Goals PT Goal: Supine/Side to Sit - Progress: Progressing toward goal PT Goal: Sit to Stand - Progress: Progressing toward goal PT Goal: Stand to Sit - Progress: Progressing toward goal PT Goal: Ambulate - Progress: Progressing toward goal  Visit Information  Last PT Received On: 10/19/11 Assistance Needed: +1    Subjective Data  Subjective: I had a rough night.   Cognition  Overall Cognitive Status: Appears within functional limits for tasks assessed/performed    Balance     End of Session PT - End of Session Activity Tolerance: Patient limited by pain Patient left: in chair;with call bell/phone within reach   GP     [facility] E 10/19/2011, 11:49 AM Pager: [phone]

## 2011-10-20 DIAGNOSIS — D62 Acute posthemorrhagic anemia: Secondary | ICD-10-CM | POA: Diagnosis not present

## 2011-10-20 LAB — CBC
HCT: 33.7 % — ABNORMAL LOW (ref 39.0–52.0)
MCH: 28.6 pg (ref 26.0–34.0)
MCHC: 33.5 g/dL (ref 30.0–36.0)
MCV: 85.3 fL (ref 78.0–100.0)
RDW: 13.1 % (ref 11.5–15.5)

## 2011-10-20 MED ORDER — DIAZEPAM 5 MG PO TABS: 5.0000 mg | ORAL_TABLET | Freq: Three times a day (TID) | ORAL | Status: DC | PRN

## 2011-10-20 MED ORDER — OXYCODONE HCL 10 MG PO TABS: 10.0000 mg | ORAL_TABLET | ORAL | Status: AC | PRN

## 2011-10-20 MED ORDER — DIAZEPAM 5 MG PO TABS: 5.0000 mg | ORAL_TABLET | Freq: Three times a day (TID) | ORAL | Status: AC | PRN

## 2011-10-20 MED ORDER — RIVAROXABAN 10 MG PO TABS: 10.0000 mg | ORAL_TABLET | Freq: Every day | ORAL | Status: AC

## 2011-10-20 NOTE — Progress Notes (Signed)
   Subjective: 3 Days Post-Op Procedure(s) (LRB): TOTAL HIP ARTHROPLASTY (Right) Patient reports pain as mild and moderate.   Patient seen in rounds with Dr. Lequita Halt.  Still having pain but is a little better.  Still likely will go home today but may nee d two sessions of therapy today. Will also change the muscle relaxant today also. Patient is well, but has had some minor complaints of pain in the hip and thigh, requiring pain medications Patient may be  ready to go home later today.  Objective: Vital signs in last 24 hours: Temp:  [99.1 F (37.3 C)-99.8 F (37.7 C)] 99.8 F (37.7 C) (09/12 0433) Pulse Rate:  [109-111] 111  (09/12 0433) Resp:  [16-19] 16  (09/12 0730) BP: (129-142)/(70-82) 129/73 mmHg (09/12 0433) SpO2:  [91 %-94 %] 91 % (09/12 0433)  Intake/Output from previous day:  Intake/Output Summary (Last 24 hours) at 10/20/11 1007 Last data filed at 10/20/11 0731  Gross per 24 hour  Intake    840 ml  Output   1250 ml  Net   -410 ml    Intake/Output this shift: Total I/O In: -  Out: 400 [Urine:400]  Labs:  [facility] 10/20/11 0411 10/19/11 0423 10/18/11 0405  HGB 11.3* 11.3* 11.7*    Basename 10/20/11 0411 10/19/11 0423  WBC 12.3* 13.6*  RBC 3.95* 3.93*  HCT 33.7* 33.8*  PLT 223 230    Basename 10/19/11 0423 10/18/11 0405  NA 135 136  K 3.7 4.0  CL 97 100  CO2 32 27  BUN 15 15  CREATININE 0.93 0.86  GLUCOSE 129* 137*  CALCIUM 8.8 8.3*   No results found for this basename: LABPT:2,INR:2 in the last 72 hours  EXAM: General - Patient is Alert, Appropriate and Oriented Extremity - Neurovascular intact Sensation intact distally Dorsiflexion/Plantar flexion intact No cellulitis present Incision - clean, dry, no drainage, healing Motor Function - intact, moving foot and toes well on exam.   Assessment/Plan: 3 Days Post-Op Procedure(s) (LRB): TOTAL HIP ARTHROPLASTY (Right) Procedure(s) (LRB): TOTAL HIP ARTHROPLASTY (Right) Past Medical History    Diagnosis Date  . Depressive disorder, not elsewhere classified   . Hip arthritis     R hip ([facility] [facility] in past)  . Impaired fasting glucose   . Obesity   . GAD (generalized anxiety disorder)   . Anxiety state, unspecified   . Cardiac disease     FAM HX  . Diverticulosis of colon 2010  . Arthritis    Principal Problem:  *OA (osteoarthritis) of hip Active Problems:  Postop Acute blood loss anemia   Up with therapy Discharge home with home health Diet - Regular diet Follow up - in 2 weeks Activity - WBAT Disposition - Home Condition Upon Discharge - Stable D/C Meds - See DC Summary DVT Prophylaxis - Xarelto  PERKINS, ALEXZANDREW 10/20/2011, 10:07 AM

## 2011-10-20 NOTE — Progress Notes (Signed)
Occupational Therapy Treatment Patient Details Name: Jacob Huber MRN: [id number] DOB: [birth year] Today's Date: 10/20/2011 Time: 4098-1191 OT Time Calculation (min): 36 min  OT Assessment / Plan / Recommendation Comments on Treatment Session Pt overall min assist for simulated slefcare and functional transfers.  Has been educated on AE use for LB selfcare as well as techniques for transferring in and out of the shower.  Will continue to follow in actue care but anticipate not follow-up OT needs.  Spouse can provide assistance at discharge.    Follow Up Recommendations  No OT follow up       Equipment Recommendations  None recommended by OT       Frequency Min 2X/week   Plan Discharge plan remains appropriate    Precautions / Restrictions Precautions Precautions: Posterior Hip Restrictions Weight Bearing Restrictions: No RLE Weight Bearing: Weight bearing as tolerated   Pertinent Vitals/Pain 6/10 in the right hip,  meds given earlier prior to session    ADL  Lower Body Dressing: Performed;Minimal assistance;Other (comment) (Pt utilized Sports administrator and sockaide.) Where Assessed - Lower Body Dressing: Supported sit to Pharmacist, hospital: Mining engineer Method: Other (comment) (ambulate with RW) Acupuncturist: Other (comment) (bedside chair, pt declined need to toilet) Toileting - Clothing Manipulation and Hygiene: Simulated;Minimal assistance Where Assessed - Engineer, mining and Hygiene: Other (comment) (sit to stand from bedside chair) Tub/Shower Transfer: Simulated;Minimal assistance Tub/Shower Transfer Method: Anterior-posterior;Other (comment) (simulated walk-in shower) Equipment Used: Reacher;Sock aid;Rolling walker Transfers/Ambulation Related to ADLs: Pt overall min assist for simulated walk-in shower transfers using the RW. ADL Comments: Provided education and practice on shower transfers and use of AE for LB  selfcare.  Also educated pt on where the equipment could be purchased as well.  Instructed pt that 3:1 could be used in the shower.  Hwever pt is afraid it may puncture holes in the base of the shower and plans to stand with his RW instead.    OT Goals ADL Goals ADL Goal: Lower Body Dressing - Progress: Progressing toward goals ADL Goal: Toilet Transfer - Progress: Progressing toward goals ADL Goal: Toileting - Clothing Manipulation - Progress: Progressing toward goals ADL Goal: Tub/Shower Transfer - Progress: Progressing toward goals  Visit Information  Last OT Received On: 10/20/11 Assistance Needed: +1    Subjective Data  Subjective: "Is it normal to have pain like this on day 3." Patient Stated Goal: To get back to doing things for himself.      Cognition  Overall Cognitive Status: Appears within functional limits for tasks assessed/performed Arousal/Alertness: Awake/alert Orientation Level: Appears intact for tasks assessed Behavior During Session: [facility] for tasks performed    Mobility  Shoulder Instructions Transfers Transfers: Sit to Stand Sit to Stand: 4: Min assist;With upper extremity assist;With armrests;From chair/3-in-1 Stand to Sit: 4: Min assist;With upper extremity assist;With armrests;To chair/3-in-1          Balance Balance Balance Assessed: Yes Dynamic Standing Balance Dynamic Standing - Balance Support: Right upper extremity supported;Left upper extremity supported Dynamic Standing - Level of Assistance: 4: Min assist   End of Session OT - End of Session Activity Tolerance: Patient limited by pain Patient left: in chair;with call bell/phone within reach     [facility] OTR/L Pager number [phone] 10/20/2011, 10:27 AM

## 2011-10-20 NOTE — Progress Notes (Addendum)
Physical Therapy Treatment Patient Details Name: Jacob Huber MRN: [id number] DOB: 1945-07-21 Today's Date: 10/20/2011 Time: 7829-5621 PT Time Calculation (min): 23 min  PT Assessment / Plan / Recommendation Comments on Treatment Session  Pt ambulated 60 feet in hallway and performed steps.  Pt given handout on stairs as spouse to assist (spouse not present).      Follow Up Recommendations  Home health PT    Barriers to Discharge        Equipment Recommendations  None recommended by PT    Recommendations for Other Services    Frequency     Plan Discharge plan remains appropriate;Frequency remains appropriate    Precautions / Restrictions Precautions Precautions: Posterior Hip Restrictions Weight Bearing Restrictions: No RLE Weight Bearing: Weight bearing as tolerated   Pertinent Vitals/Pain 8/10 R hip pain, premedicated, repositioned    Mobility  Bed Mobility Bed Mobility: Supine to Sit Sit to Supine: 5: Supervision;HOB flat Details for Bed Mobility Assistance: increased time Transfers Transfers: Stand to Sit;Sit to Stand Sit to Stand: 4: Min guard;From chair/3-in-1;With upper extremity assist Stand to Sit: 4: Min guard;To chair/3-in-1;With upper extremity assist Details for Transfer Assistance: increased time, no verbal cues required Ambulation/Gait Ambulation/Gait Assistance: 4: Min guard Ambulation Distance (Feet): 60 Feet Assistive device: Rolling walker Ambulation/Gait Assistance Details: verbal cue for posture, continues to be very slow due to pain Gait Pattern: Step-to pattern;Decreased stance time - right Gait velocity: decreased Stairs: Yes Stairs Assistance: 4: Min guard Stairs Assistance Details (indicate cue type and reason): verbal cues for sequence and safety Stair Management Technique: Backwards;Step to pattern;With walker Number of Stairs: 3     Exercises     PT Diagnosis:    PT Problem List:   PT Treatment Interventions:     PT  Goals Acute Rehab PT Goals PT Goal: Sit to Stand - Progress: Progressing toward goal PT Goal: Stand to Sit - Progress: Progressing toward goal PT Goal: Ambulate - Progress: Progressing toward goal PT Goal: Up/Down Stairs - Progress: Progressing toward goal  Visit Information  Last PT Received On: 10/20/11 Assistance Needed: +1    Subjective Data  Subjective: pt continues to have pain but up in recliner on arrival   Cognition  Overall Cognitive Status: Appears within functional limits for tasks assessed/performed Arousal/Alertness: Awake/alert Orientation Level: Appears intact for tasks assessed Behavior During Session: [facility] for tasks performed    Balance  Balance Balance Assessed: Yes Dynamic Standing Balance Dynamic Standing - Balance Support: Right upper extremity supported;Left upper extremity supported Dynamic Standing - Level of Assistance: 4: Min assist  End of Session PT - End of Session Equipment Utilized During Treatment: Gait belt Activity Tolerance: Patient limited by pain Patient left: in bed;with call bell/phone within reach   GP     Fahmida Jurich,KATHrine E 10/20/2011, 11:59 AM Pager: [phone]

## 2011-10-20 NOTE — Progress Notes (Signed)
Pt to d/c home with home health. AVS reviewed. Pt capable of verbalizing medications and follow-up appointments with Dr. Lequita Halt. Remains hemodynamically stable. No signs and symptoms of distress. Educated pt to return to ER in the case of SOB, dizziness, or chest pain.

## 2011-10-20 NOTE — Progress Notes (Signed)
Discharge summary sent to payer through [facility]  

## 2011-10-20 NOTE — Discharge Summary (Signed)
Physician Discharge Summary   Patient ID: Jacob Huber MRN: [id number] DOB/AGE: 1945/05/14 66 y.o.  Admit date: 10/17/2011 Discharge date: 10/20/2011  Primary Diagnosis: Osteoarthritis Right hip   Admission Diagnoses:  Past Medical History  Diagnosis Date  . Depressive disorder, not elsewhere classified   . Hip arthritis     R hip ([facility] [facility] in past)  . Impaired fasting glucose   . Obesity   . GAD (generalized anxiety disorder)   . Anxiety state, unspecified   . Cardiac disease     FAM HX  . Diverticulosis of colon 2010  . Arthritis    Discharge Diagnoses:   Principal Problem:  *OA (osteoarthritis) of hip Active Problems:  Postop Acute blood loss anemia  Procedure: Procedure(s) (LRB): TOTAL HIP ARTHROPLASTY (Right)   Consults: None  HPI: Jacob Huber is a 66 y.o. male with end stage arthritis of his right hip with progressively worsening pain and dysfunction. Pain occurs with activity and rest including pain at night. He has tried analgesics, protected weight bearing and rest without benefit. Pain is too severe to attempt physical therapy. Radiographs demonstrate bone on bone arthritis with subchondral cyst formation and large osteophytes. He presents now for right THA.     Laboratory Data: Hospital Outpatient Visit on 10/11/2011  Component Date Value Range Status  . aPTT 10/11/2011 30  24 - 37 seconds Final  . WBC 10/11/2011 5.7  4.0 - 10.5 K/uL Final  . RBC 10/11/2011 4.90  4.22 - 5.81 MIL/uL Final  . Hemoglobin 10/11/2011 14.0  13.0 - 17.0 g/dL Final  . HCT 30/86/5784 41.9  39.0 - 52.0 % Final  . MCV 10/11/2011 85.5  78.0 - 100.0 fL Final  . MCH 10/11/2011 28.6  26.0 - 34.0 pg Final  . MCHC 10/11/2011 33.4  30.0 - 36.0 g/dL Final  . RDW 69/62/9528 13.0  11.5 - 15.5 % Final  . Platelets 10/11/2011 276  150 - 400 K/uL Final  . Sodium 10/11/2011 138  135 - 145 mEq/L Final  . Potassium 10/11/2011 4.4  3.5 - 5.1 mEq/L Final  . Chloride 10/11/2011 103  96 -  112 mEq/L Final  . CO2 10/11/2011 25  19 - 32 mEq/L Final  . Glucose, Bld 10/11/2011 116* 70 - 99 mg/dL Final  . BUN 41/32/4401 22  6 - 23 mg/dL Final  . Creatinine, Ser 10/11/2011 0.88  0.50 - 1.35 mg/dL Final  . Calcium 02/72/5366 8.8  8.4 - 10.5 mg/dL Final  . Total Protein 10/11/2011 7.0  6.0 - 8.3 g/dL Final  . Albumin 44/04/4740 3.9  3.5 - 5.2 g/dL Final  . AST 59/56/3875 20  0 - 37 U/L Final  . ALT 10/11/2011 21  0 - 53 U/L Final  . Alkaline Phosphatase 10/11/2011 61  39 - 117 U/L Final  . Total Bilirubin 10/11/2011 0.3  0.3 - 1.2 mg/dL Final  . GFR calc non Af Amer 10/11/2011 90* >90 mL/min Final  . GFR calc Af Amer 10/11/2011 >90  >90 mL/min Final   Comment:                                 The eGFR has been calculated                          using the CKD EPI equation.  This calculation has not been                          validated in all clinical                          situations.                          eGFR's persistently                          <90 mL/min signify                          possible Chronic Kidney Disease.  Marland Kitchen Prothrombin Time 10/11/2011 12.1  11.6 - 15.2 seconds Final  . INR 10/11/2011 0.88  0.00 - 1.49 Final  . Color, Urine 10/11/2011 YELLOW  YELLOW Final  . APPearance 10/11/2011 CLEAR  CLEAR Final  . Specific Gravity, Urine 10/11/2011 1.028  1.005 - 1.030 Final  . pH 10/11/2011 5.5  5.0 - 8.0 Final  . Glucose, UA 10/11/2011 NEGATIVE  NEGATIVE mg/dL Final  . Hgb urine dipstick 10/11/2011 NEGATIVE  NEGATIVE Final  . Bilirubin Urine 10/11/2011 NEGATIVE  NEGATIVE Final  . Ketones, ur 10/11/2011 NEGATIVE  NEGATIVE mg/dL Final  . Protein, ur 86/57/8469 NEGATIVE  NEGATIVE mg/dL Final  . Urobilinogen, UA 10/11/2011 0.2  0.0 - 1.0 mg/dL Final  . Nitrite 62/95/2841 NEGATIVE  NEGATIVE Final  . Leukocytes, UA 10/11/2011 NEGATIVE  NEGATIVE Final   MICROSCOPIC NOT DONE ON URINES WITH NEGATIVE PROTEIN, BLOOD, LEUKOCYTES, NITRITE, OR  GLUCOSE <1000 mg/dL.  Marland Kitchen MRSA, PCR 10/11/2011 NEGATIVE  NEGATIVE Final  . Staphylococcus aureus 10/11/2011 NEGATIVE  NEGATIVE Final   Comment:                                 The Xpert SA Assay (FDA                          approved for NASAL specimens                          in patients over 71 years of age),                          is one component of                          a comprehensive surveillance                          program.  Test performance has                          been validated by [employer] for patients greater                          than or equal to 55 year old.  It is not intended                          to diagnose infection nor to                          guide or monitor treatment.    Basename 10/20/11 0411 10/19/11 0423 10/18/11 0405  HGB 11.3* 11.3* 11.7*    Basename 10/20/11 0411 10/19/11 0423  WBC 12.3* 13.6*  RBC 3.95* 3.93*  HCT 33.7* 33.8*  PLT 223 230    Basename 10/19/11 0423 10/18/11 0405  NA 135 136  K 3.7 4.0  CL 97 100  CO2 32 27  BUN 15 15  CREATININE 0.93 0.86  GLUCOSE 129* 137*  CALCIUM 8.8 8.3*   No results found for this basename: LABPT:2,INR:2 in the last 72 hours  X-Rays:Dg Hip Complete Right  10/11/2011  *RADIOLOGY REPORT*  Clinical Data: Preoperative films.  Patient for hip replacement.  RIGHT HIP - COMPLETE 2+ VIEW  Comparison: Plain films 08/24/2007.  Findings: There is no fracture or dislocation.  Again seen is advanced right hip osteoarthritis with joint space narrowing and prominent osteophytosis present.  The left hip is unremarkable. Visualized lower lumbar spine appears normal.  IMPRESSION: No acute finding.  Advanced right hip degenerative change.   Original Report Authenticated By: Bernadene Bell. Maricela Curet, M.D.    Dg Pelvis Portable  10/17/2011  *RADIOLOGY REPORT*  Clinical Data: Hip pain.  Right hip osteoarthritis.  PORTABLE PELVIS  Comparison: 10/11/2011   Findings: The patient is status post right total hip arthroplasty. No adverse features.  IMPRESSION: As above.   Original Report Authenticated By: Elsie Stain, M.D.    Dg Hip Portable 1 View Right  10/17/2011  *RADIOLOGY REPORT*  Clinical Data: Right hip osteoarthritis.  PORTABLE RIGHT HIP - 1 VIEW  Comparison: 10/11/2011  Findings: Patient is status post right total hip arthroplasty. Satisfactory position and alignment of the femoral and acetabular components.  Surgical drain good position.  IMPRESSION: Satisfactory postoperative appearance.   Original Report Authenticated By: Elsie Stain, M.D.     EKG:No orders found for this or any previous visit.   Hospital Course: Patient was admitted to [facility] and taken to the OR and underwent the above state procedure without complications.  Patient tolerated the procedure well and was later transferred to the recovery room and then to the orthopaedic floor for postoperative care.  They were given PO and IV analgesics for pain control following their surgery.  They were given 24 hours of postoperative antibiotics and started on DVT prophylaxis in the form of Xarelto.   PT and OT were ordered for total hip protocol.  The patient was allowed to be WBAT with therapy. Discharge planning was consulted to help with postop disposition and equipment needs.  Patient had a tough night on the evening of surgery but doing better the next morning and started to get up OOB with therapy on day one.  Hemovac drain was pulled without difficulty.  The knee immobilizer was removed and discontinued.  Pain got worse thru the night requiring IV med change. Also increased the oral dose.  Continued to work with therapy into day two.  Dressing was changed on day two and the incision was healing well.  By day three, still having pain but was a little better.  The patient had progressed with therapy and meeting their goals.  Incision was healing well.  Patient was seen in  rounds and was ready to go home later that day.  Discharge Medications: Prior to Admission medications   Medication Sig Start Date End Date Taking? Authorizing Provider  acetaminophen (TYLENOL) 500 MG tablet Take 1,000 mg by mouth every 6 (six) hours as needed. Pain   Yes Historical Provider, MD  DULoxetine (CYMBALTA) 60 MG capsule Take 60 mg by mouth daily with breakfast.   Yes Historical Provider, MD  traMADol (ULTRAM) 50 MG tablet Take 50 mg by mouth 4 (four) times daily.   Yes Historical Provider, MD  zolpidem (AMBIEN) 10 MG tablet Take 10 mg by mouth at bedtime as needed. Sleep   Yes Historical Provider, MD  diazepam (VALIUM) 5 MG tablet Take 1 tablet (5 mg total) by mouth every 8 (eight) hours as needed. 10/20/11 10/30/11  Marlean Mortell, PA  oxyCODONE 10 MG TABS Take 1-2 tablets (10-20 mg total) by mouth every 4 (four) hours as needed for pain. 10/20/11 10/30/11  Dayzha Pogosyan Julien Girt, PA  rivaroxaban (XARELTO) 10 MG TABS tablet Take 1 tablet (10 mg total) by mouth daily with breakfast. Take Xarelto for two and a half more weeks, then discontinue Xarelto. Once the patient has completed the Xarelto, they may resume the 325 mg Aspirin. 10/20/11   Timur Nibert Julien Girt, PA    Diet: Regular diet Activity:WBAT No bending hip over 90 degrees- A "L" Angle Do not cross legs Do not let foot roll inward When turning these patients a pillow should be placed between the patient's legs to prevent crossing. Patients should have the affected knee fully extended when trying to sit or stand from all surfaces to prevent excessive hip flexion. When ambulating and turning toward the affected side the affected leg should have the toes turned out prior to moving the walker and the rest of patient's body as to prevent internal rotation/ turning in of the leg. Abduction pillows are the most effective way to prevent a patient from not crossing legs or turning toes in at rest. If an abduction pillow is not ordered  placing a regular pillow length wise between the patient's legs is also an effective reminder. It is imperative that these precautions be maintained so that the surgical hip does not dislocate. Follow-up:in 2 weeks Disposition - Home Discharged Condition: good   Discharge Orders    Future Orders Please Complete By Expires   Diet general      Call MD / Call 911      Comments:   If you experience chest pain or shortness of breath, CALL 911 and be transported to the hospital emergency room.  If you develope a fever above 101 F, pus (white drainage) or increased drainage or redness at the wound, or calf pain, call your surgeon's office.   Discharge instructions      Comments:   Pick up stool softner and laxative for home. Do not submerge incision under water. May shower. Continue to use ice for pain and swelling from surgery. Hip precautions.  Total Hip Protocol.  Take Xarelto for two and a half more weeks, then discontinue Xarelto. Once the patient has completed the Xarelto, they may resume the 325 mg Aspirin.   Constipation Prevention      Comments:   Drink plenty of fluids.  Prune juice may be helpful.  You may use a stool softener, such as Colace (over the counter) 100 mg twice a day.  Use MiraLax (over the counter) for constipation as  needed.   Increase activity slowly as tolerated      Patient may shower      Comments:   You may shower without a dressing once there is no drainage.  Do not wash over the wound.  If drainage remains, do not shower until drainage stops.   Driving restrictions      Comments:   No driving until released by the physician.   Lifting restrictions      Comments:   No lifting until released by the physician.   Follow the hip precautions as taught in Physical Therapy      Change dressing      Comments:   You may change your dressing dressing daily with sterile 4 x 4 inch gauze dressing and paper tape.  Do not submerge the incision under water.   TED  hose      Comments:   Use stockings (TED hose) for 3 weeks on both leg(s).  You may remove them at night for sleeping.   Do not sit on low chairs, stoools or toilet seats, as it may be difficult to get up from low surfaces          Medication List     As of 10/20/2011 10:16 AM    STOP taking these medications         aspirin 325 MG tablet      b complex vitamins tablet      fish oil-omega-3 fatty acids 1000 MG capsule      GLUCOSAMINE CHONDR 1500 COMPLX PO      multivitamin with minerals tablet      naproxen sodium 220 MG tablet   Commonly known as: ANAPROX      saw palmetto 500 MG capsule      vitamin C 1000 MG tablet      Vitamin D3 5000 UNITS Caps      vitamin E 800 UNIT capsule      TAKE these medications         acetaminophen 500 MG tablet   Commonly known as: TYLENOL   Take 1,000 mg by mouth every 6 (six) hours as needed. Pain      diazepam 5 MG tablet   Commonly known as: VALIUM   Take 1 tablet (5 mg total) by mouth every 8 (eight) hours as needed.      DULoxetine 60 MG capsule   Commonly known as: CYMBALTA   Take 60 mg by mouth daily with breakfast.      Oxycodone HCl 10 MG Tabs   Take 1-2 tablets (10-20 mg total) by mouth every 4 (four) hours as needed for pain.      rivaroxaban 10 MG Tabs tablet   Commonly known as: XARELTO   Take 1 tablet (10 mg total) by mouth daily with breakfast. Take Xarelto for two and a half more weeks, then discontinue Xarelto.  Once the patient has completed the Xarelto, they may resume the 325 mg Aspirin.      traMADol 50 MG tablet   Commonly known as: ULTRAM   Take 50 mg by mouth 4 (four) times daily.      zolpidem 10 MG tablet   Commonly known as: AMBIEN   Take 10 mg by mouth at bedtime as needed. Sleep           Follow-up Information    Follow up with Loanne Drilling, MD. Schedule an appointment as soon as possible for a visit in 2 weeks.  Contact information:   [facility] [address], [address] [place] [address] [phone]          Signed: Patrica Duel 10/20/2011, 10:16 AM

## 2011-12-11 ENCOUNTER — Other Ambulatory Visit: Payer: Self-pay | Admitting: Family Medicine

## 2011-12-12 NOTE — Telephone Encounter (Signed)
IS THIS OK 

## 2011-12-12 NOTE — Telephone Encounter (Signed)
rx was sent x 3 months.  He is due for CPE.  Had pre-op visit in August, but due for CPE, please schedule at his convenience sometime in the next few months.

## 2011-12-14 NOTE — Telephone Encounter (Signed)
Called patient and notified him ,he will check his schedule and call back to schedule CPE.

## 2013-06-25 ENCOUNTER — Telehealth: Payer: Self-pay | Admitting: Internal Medicine

## 2013-06-25 NOTE — Telephone Encounter (Signed)
Fax[facility] records [facility] [place] @ [phone]

## 2014-03-03 IMAGING — CR DG HIP COMPLETE 2+V*R*
3 series · 3 of 3 positions shown · non-contrast
Comparison: Plain films 08/24/2007.

CLINICAL DATA: Preoperative films.  Patient for hip replacement.

RIGHT HIP - COMPLETE 2+ VIEW

[t pelvis a.p.]
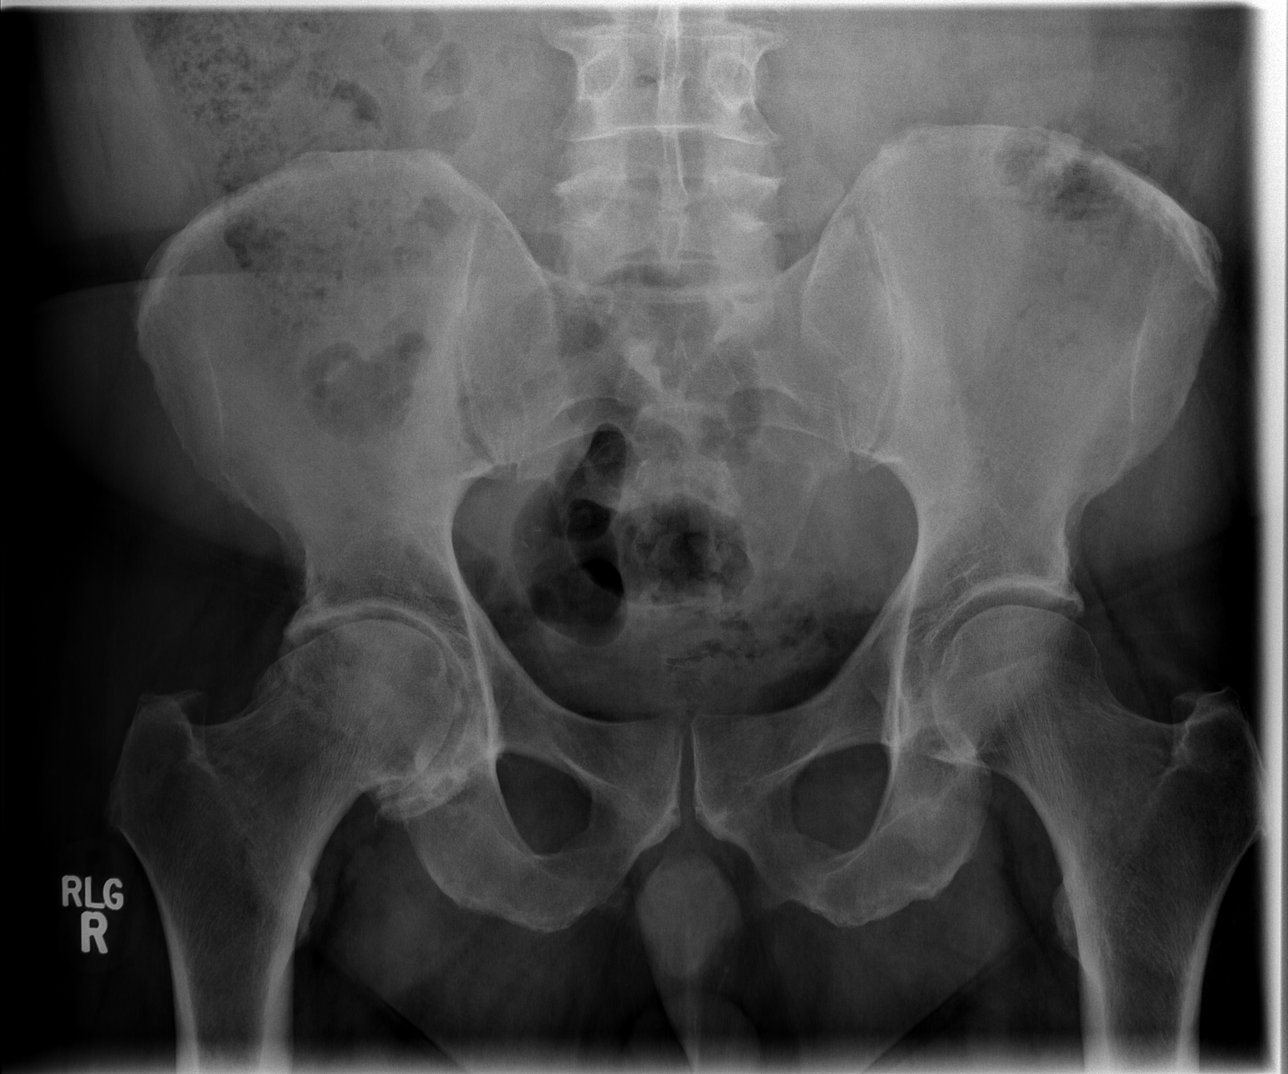

[t hip ap right]
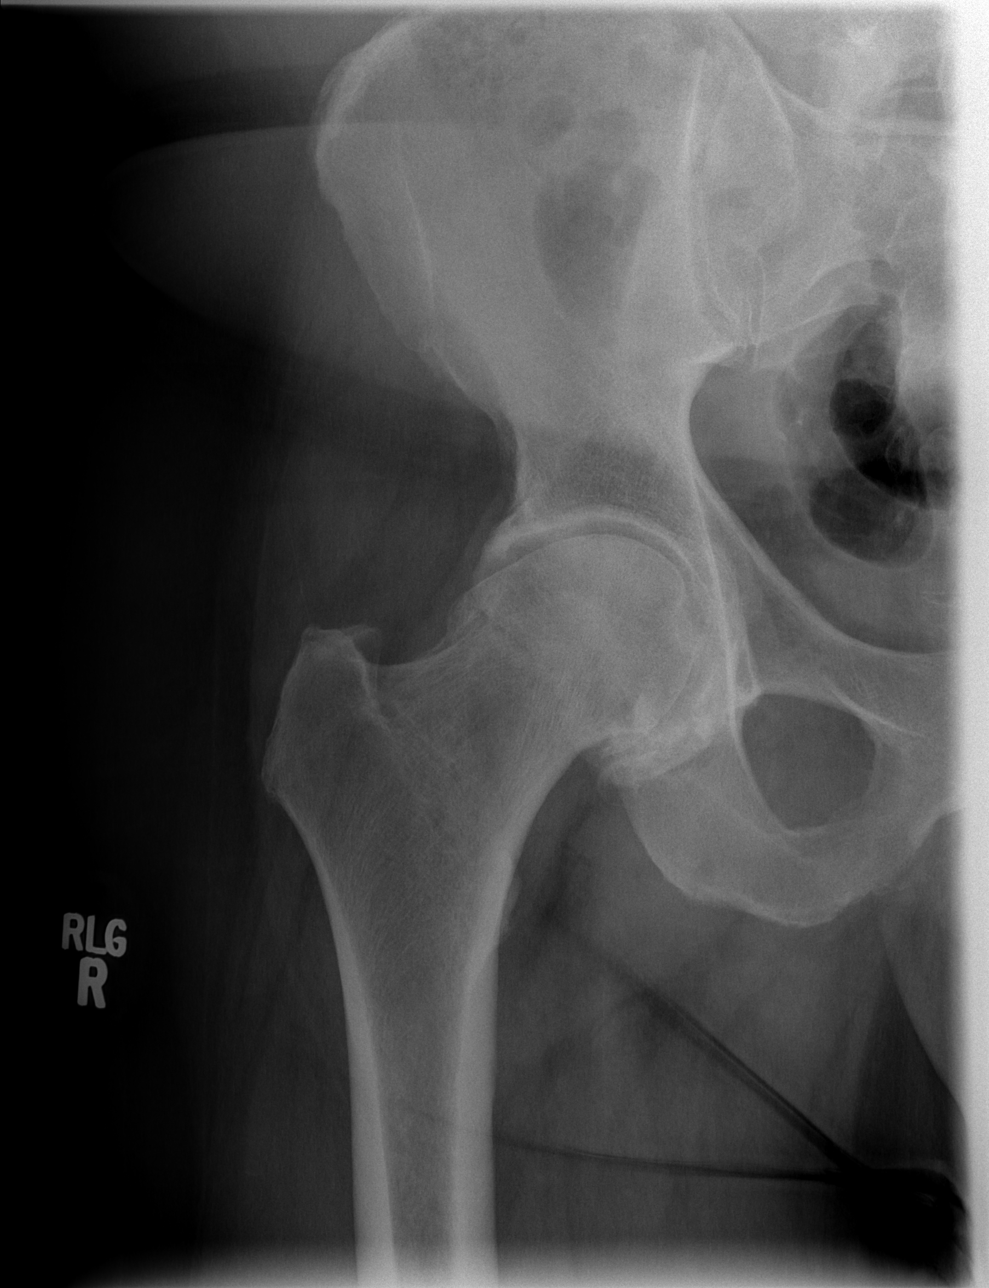

[t hip frog leg right]
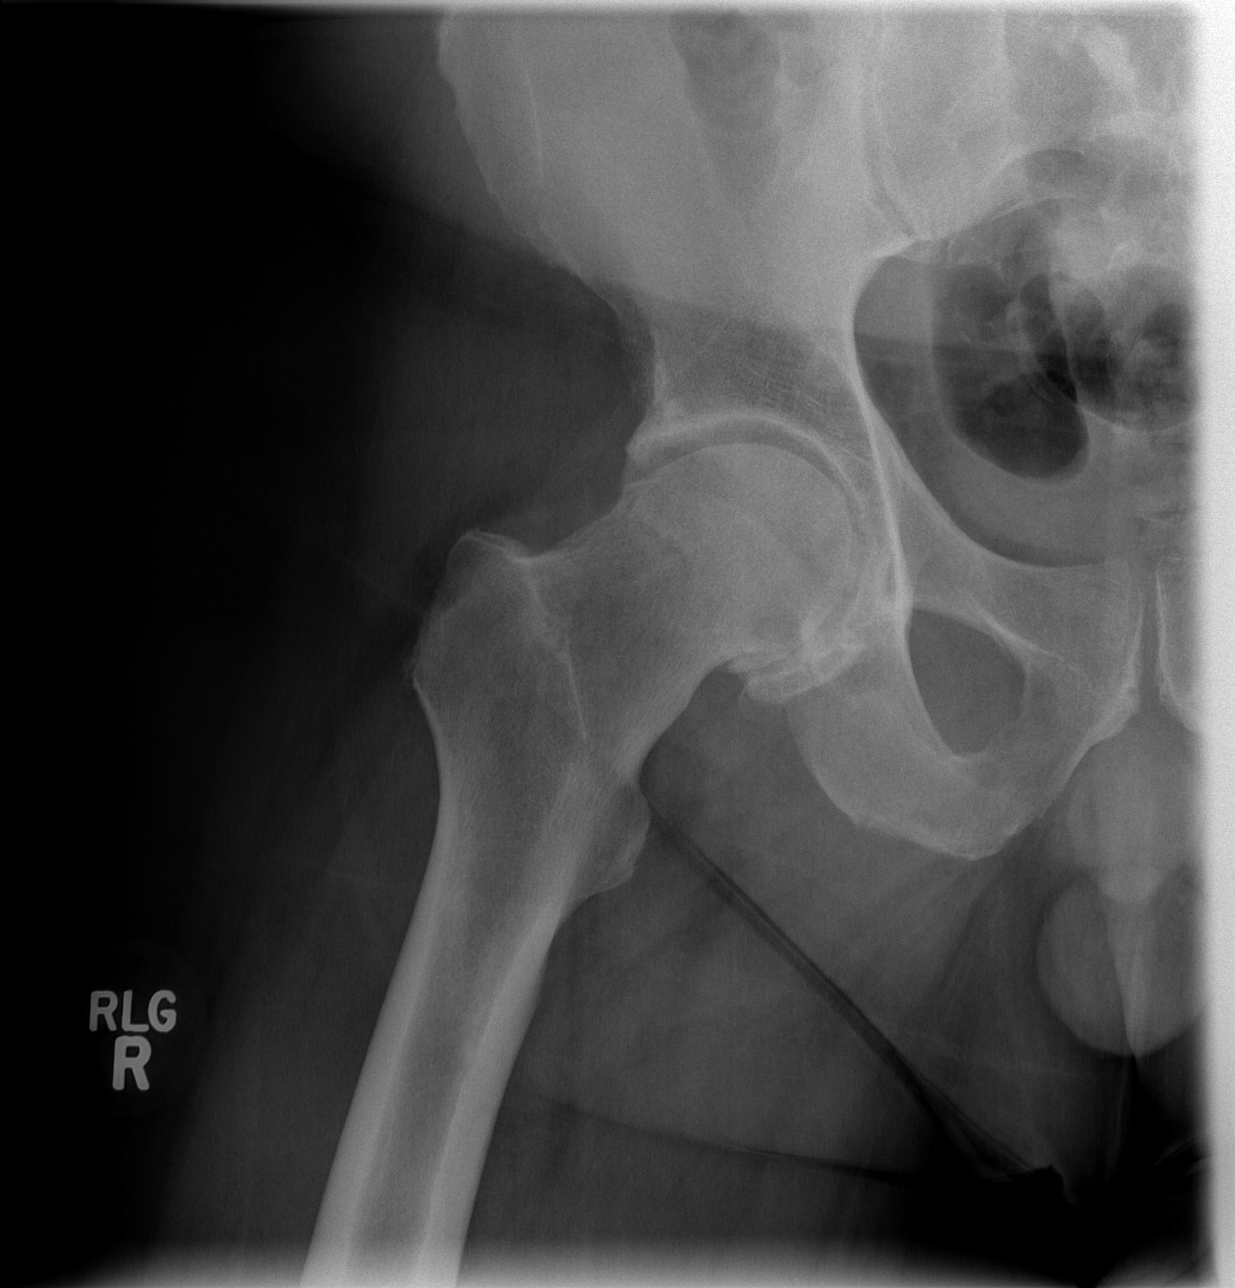

[3 of 3 positions shown; findings below may reference images not displayed]

FINDINGS: There is no fracture or dislocation.  Again seen is
advanced right hip osteoarthritis with joint space narrowing and
prominent osteophytosis present.  The left hip is unremarkable.
Visualized lower lumbar spine appears normal.
IMPRESSION: No acute finding.  Advanced right hip degenerative change.

## 2014-10-30 ENCOUNTER — Encounter: Payer: Self-pay | Admitting: Gastroenterology

## 2017-09-15 ENCOUNTER — Encounter: Payer: Self-pay | Admitting: Gastroenterology
